# Patient Record
Sex: Male | Born: 1975 | Race: Black or African American | Hispanic: No | Marital: Married | Smoking: Never smoker
Health system: Southern US, Community
[De-identification: ages and names within clinical notes are randomized; demographics above are authoritative.]

## PROBLEM LIST (undated history)

## (undated) DIAGNOSIS — F419 Anxiety disorder, unspecified: Secondary | ICD-10-CM

## (undated) HISTORY — DX: Anxiety disorder, unspecified: F41.9

---

## 2001-12-08 ENCOUNTER — Encounter: Payer: Self-pay | Admitting: *Deleted

## 2001-12-08 ENCOUNTER — Emergency Department (HOSPITAL_COMMUNITY): Admission: EM | Admit: 2001-12-08 | Discharge: 2001-12-08 | Payer: Self-pay | Admitting: *Deleted

## 2004-01-08 ENCOUNTER — Emergency Department (HOSPITAL_COMMUNITY): Admission: EM | Admit: 2004-01-08 | Discharge: 2004-01-08 | Payer: Self-pay | Admitting: Emergency Medicine

## 2015-08-09 ENCOUNTER — Encounter: Payer: Self-pay | Admitting: Nurse Practitioner

## 2015-08-09 ENCOUNTER — Ambulatory Visit (INDEPENDENT_AMBULATORY_CARE_PROVIDER_SITE_OTHER): Payer: BLUE CROSS/BLUE SHIELD

## 2015-08-09 ENCOUNTER — Ambulatory Visit (INDEPENDENT_AMBULATORY_CARE_PROVIDER_SITE_OTHER): Payer: BLUE CROSS/BLUE SHIELD | Admitting: Nurse Practitioner

## 2015-08-09 VITALS — BP 111/77 | HR 93 | Temp 98.6°F | Ht 75.0 in | Wt 226.0 lb

## 2015-08-09 DIAGNOSIS — M5442 Lumbago with sciatica, left side: Secondary | ICD-10-CM

## 2015-08-09 MED ORDER — PREDNISONE 10 MG (21) PO TBPK: 10.0000 mg | ORAL_TABLET | Freq: Every day | ORAL | Status: DC

## 2015-08-09 MED ORDER — CYCLOBENZAPRINE HCL 10 MG PO TABS: 10.0000 mg | ORAL_TABLET | Freq: Three times a day (TID) | ORAL | Status: DC | PRN

## 2015-08-09 NOTE — Progress Notes (Signed)
Subjective:    Patient ID: Norman Hardy, male    DOB: 07-06-1975, 40 y.o.   MRN: [id number]   Patient in c/o back pain:  First incidence happened a year ago working in the yard pulling a bush from the ground. Hurts the most when rising from a sitting or lying position. Went to urgent care on Monday and they gave him a prescription for pain which does not help much.  Back Pain This is a recurrent problem. The current episode started more than 1 year ago. The problem occurs every several days. The problem has been gradually worsening since onset. The pain is present in the lumbar spine. The quality of the pain is described as aching, shooting and stabbing. The pain radiates to the right thigh and left thigh. The pain is at a severity of 9/10. The pain is severe. The pain is the same all the time. The symptoms are aggravated by lying down and standing. Stiffness is present in the morning. Associated symptoms include leg pain. He has tried heat for the symptoms. The treatment provided mild relief.      Review of Systems  Constitutional: Negative.   Cardiovascular: Negative.   Gastrointestinal: Negative.   Endocrine: Negative.   Musculoskeletal: Positive for back pain.  Skin: Negative.   Allergic/Immunologic: Negative.   Neurological: Negative.        Objective:   Physical Exam  Constitutional: He is oriented to person, place, and time. He appears well-developed and well-nourished.  HENT:  Right Ear: External ear normal.  Left Ear: External ear normal.  Nose: Nose normal.  Mouth/Throat: Oropharynx is clear and moist.  Eyes: Conjunctivae are normal.  Neck: Normal range of motion. Neck supple.  Cardiovascular: Normal rate, regular rhythm and normal heart sounds.   Pulmonary/Chest: Effort normal and breath sounds normal.  Abdominal: Soft. Bowel sounds are normal.  Musculoskeletal:   FROM of lumbar spine- as long as mover slowly Point tenderness mid lower back (+) SLR on left  at 45 degrees (-) SLR right Motor strength and sensation distally intact  Neurological: He is alert and oriented to person, place, and time.  Skin: Skin is warm and dry.  Psychiatric: He has a normal mood and affect. His behavior is normal. Judgment and thought content normal.  Vitals reviewed.   BP 111/77 mmHg  Pulse 93  Temp(Src) 98.6 F (37 C) (Oral)  Ht 6\' 3"  (1.905 m)  Wt 226 lb (102.513 kg)  BMI 28.25 kg/m2  Lumbar x ray- moderate stool burden on right- mild scoliosis otherwise normal- Preliminary reading by Paulene FloorMary Lashannon Bresnan, FNP  [facility]       Assessment & Plan:   1. Bilateral low back pain with left-sided sciatica    Meds ordered this encounter  Medications  . predniSONE (STERAPRED UNI-PAK 21 TAB) 10 MG (21) TBPK tablet    Sig: Take 1 tablet (10 mg total) by mouth daily. As directed x 6 days    Dispense:  21 tablet    Refill:  0    Order Specific Question:  Supervising Provider    Answer:  Ernestina PennaMOORE, DONALD W [[id number]]  . cyclobenzaprine (FLEXERIL) 10 MG tablet    Sig: Take 1 tablet (10 mg total) by mouth 3 (three) times daily as needed for muscle spasms.    Dispense:  30 tablet    Refill:  1    Order Specific Question:  Supervising Provider    Answer:  Ernestina PennaMOORE, DONALD W [[id number]]   Moist heat  Rest RTO prn Will order MRI if gets no relief from  Meds.  Midge Aver FNP Student .mmms

## 2015-08-09 NOTE — Patient Instructions (Signed)

## 2016-10-10 DIAGNOSIS — H0011 Chalazion right upper eyelid: Secondary | ICD-10-CM | POA: Diagnosis not present

## 2016-10-19 DIAGNOSIS — H00011 Hordeolum externum right upper eyelid: Secondary | ICD-10-CM | POA: Diagnosis not present

## 2016-12-31 ENCOUNTER — Ambulatory Visit (INDEPENDENT_AMBULATORY_CARE_PROVIDER_SITE_OTHER): Payer: BLUE CROSS/BLUE SHIELD | Admitting: Pediatrics

## 2016-12-31 ENCOUNTER — Encounter: Payer: Self-pay | Admitting: Pediatrics

## 2016-12-31 VITALS — BP 125/87 | HR 100 | Temp 99.0°F | Ht 75.0 in | Wt 232.0 lb

## 2016-12-31 DIAGNOSIS — K603 Anal fistula: Secondary | ICD-10-CM | POA: Diagnosis not present

## 2016-12-31 DIAGNOSIS — K611 Rectal abscess: Secondary | ICD-10-CM | POA: Diagnosis not present

## 2016-12-31 DIAGNOSIS — L0231 Cutaneous abscess of buttock: Secondary | ICD-10-CM | POA: Diagnosis not present

## 2016-12-31 NOTE — Progress Notes (Signed)
  Subjective:   Patient ID: Norman Hardy, male    DOB: Feb 20, 1976, 41 y.o.   MRN: [id number] CC: Recurrent Skin Infections  HPI: Norman Hardy is a 41 y.o. male presenting for Recurrent Skin Infections  Started about a month ago Has been working but has a lot of pain No fevers at home Appetite has been down Has been draining pus fluid, sometimes a lot, for the past 2 weeks Not seen a doctor for this before   Relevant past medical, surgical, family and social history reviewed. Allergies and medications reviewed and updated. History  Smoking Status  . Never Smoker  Smokeless Tobacco  . Never Used   ROS: Per HPI   Objective:    BP 125/87   Pulse 100   Temp 99 F (37.2 C) (Oral)   Ht 6\' 3"  (1.905 m)   Wt 232 lb (105.2 kg)   BMI 29.00 kg/m   Wt Readings from Last 3 Encounters:  12/31/16 232 lb (105.2 kg)  08/09/15 226 lb (102.5 kg)    Gen: NAD, alert, cooperative with exam, NCAT EYES: EOMI, no conjunctival injection, or no icterus CV: no murmur Resp:  normal WOB Abd: +BS, soft, NTND. Ext: No edema, warm Neuro: Alert and oriented MSK: normal muscle bulk Rectal: large approx 20cm area of induration to R of rectum along inside of gluteal cleft, three areas of drainage of white-yellow fluid  Assessment & Plan:  Norman Hardy was seen today for recurrent skin infections.  Diagnoses and all orders for this visit:  Rectal abscess Given location and size not able to drain in office. Pt to go to ED, may need imaging and surgical evaluation. Discussed with pt, he is in agreement.  Follow up plan: As needed Rex Kras, MD [facility]

## 2017-01-01 DIAGNOSIS — K610 Anal abscess: Secondary | ICD-10-CM | POA: Diagnosis not present

## 2017-01-01 DIAGNOSIS — F17200 Nicotine dependence, unspecified, uncomplicated: Secondary | ICD-10-CM | POA: Diagnosis not present

## 2017-01-01 DIAGNOSIS — K603 Anal fistula: Secondary | ICD-10-CM | POA: Diagnosis not present

## 2017-01-01 DIAGNOSIS — B9562 Methicillin resistant Staphylococcus aureus infection as the cause of diseases classified elsewhere: Secondary | ICD-10-CM | POA: Diagnosis not present

## 2017-01-01 DIAGNOSIS — L0231 Cutaneous abscess of buttock: Secondary | ICD-10-CM | POA: Diagnosis not present

## 2017-01-05 DIAGNOSIS — K603 Anal fistula: Secondary | ICD-10-CM | POA: Diagnosis not present

## 2017-01-05 DIAGNOSIS — Z48817 Encounter for surgical aftercare following surgery on the skin and subcutaneous tissue: Secondary | ICD-10-CM | POA: Diagnosis not present

## 2017-01-05 DIAGNOSIS — L0231 Cutaneous abscess of buttock: Secondary | ICD-10-CM | POA: Diagnosis not present

## 2017-01-06 DIAGNOSIS — K603 Anal fistula: Secondary | ICD-10-CM | POA: Diagnosis not present

## 2017-01-06 DIAGNOSIS — L0231 Cutaneous abscess of buttock: Secondary | ICD-10-CM | POA: Diagnosis not present

## 2017-01-06 DIAGNOSIS — Z48817 Encounter for surgical aftercare following surgery on the skin and subcutaneous tissue: Secondary | ICD-10-CM | POA: Diagnosis not present

## 2017-01-07 DIAGNOSIS — Z48817 Encounter for surgical aftercare following surgery on the skin and subcutaneous tissue: Secondary | ICD-10-CM | POA: Diagnosis not present

## 2017-01-07 DIAGNOSIS — L0231 Cutaneous abscess of buttock: Secondary | ICD-10-CM | POA: Diagnosis not present

## 2017-01-07 DIAGNOSIS — K603 Anal fistula: Secondary | ICD-10-CM | POA: Diagnosis not present

## 2017-01-08 DIAGNOSIS — K603 Anal fistula: Secondary | ICD-10-CM | POA: Diagnosis not present

## 2017-01-08 DIAGNOSIS — Z48817 Encounter for surgical aftercare following surgery on the skin and subcutaneous tissue: Secondary | ICD-10-CM | POA: Diagnosis not present

## 2017-01-08 DIAGNOSIS — L0231 Cutaneous abscess of buttock: Secondary | ICD-10-CM | POA: Diagnosis not present

## 2017-01-09 DIAGNOSIS — Z48817 Encounter for surgical aftercare following surgery on the skin and subcutaneous tissue: Secondary | ICD-10-CM | POA: Diagnosis not present

## 2017-01-09 DIAGNOSIS — L0231 Cutaneous abscess of buttock: Secondary | ICD-10-CM | POA: Diagnosis not present

## 2017-01-09 DIAGNOSIS — K610 Anal abscess: Secondary | ICD-10-CM | POA: Diagnosis not present

## 2017-01-10 DIAGNOSIS — L0231 Cutaneous abscess of buttock: Secondary | ICD-10-CM | POA: Diagnosis not present

## 2017-01-10 DIAGNOSIS — Z48817 Encounter for surgical aftercare following surgery on the skin and subcutaneous tissue: Secondary | ICD-10-CM | POA: Diagnosis not present

## 2017-01-10 DIAGNOSIS — K610 Anal abscess: Secondary | ICD-10-CM | POA: Diagnosis not present

## 2017-01-11 DIAGNOSIS — L0231 Cutaneous abscess of buttock: Secondary | ICD-10-CM | POA: Diagnosis not present

## 2017-01-11 DIAGNOSIS — K610 Anal abscess: Secondary | ICD-10-CM | POA: Diagnosis not present

## 2017-01-11 DIAGNOSIS — Z48817 Encounter for surgical aftercare following surgery on the skin and subcutaneous tissue: Secondary | ICD-10-CM | POA: Diagnosis not present

## 2017-01-13 DIAGNOSIS — L0231 Cutaneous abscess of buttock: Secondary | ICD-10-CM | POA: Diagnosis not present

## 2017-01-13 DIAGNOSIS — K610 Anal abscess: Secondary | ICD-10-CM | POA: Diagnosis not present

## 2017-01-13 DIAGNOSIS — Z48817 Encounter for surgical aftercare following surgery on the skin and subcutaneous tissue: Secondary | ICD-10-CM | POA: Diagnosis not present

## 2017-01-14 DIAGNOSIS — K610 Anal abscess: Secondary | ICD-10-CM | POA: Diagnosis not present

## 2017-01-14 DIAGNOSIS — L0231 Cutaneous abscess of buttock: Secondary | ICD-10-CM | POA: Diagnosis not present

## 2017-01-14 DIAGNOSIS — Z48817 Encounter for surgical aftercare following surgery on the skin and subcutaneous tissue: Secondary | ICD-10-CM | POA: Diagnosis not present

## 2017-01-15 DIAGNOSIS — L0231 Cutaneous abscess of buttock: Secondary | ICD-10-CM | POA: Diagnosis not present

## 2017-01-15 DIAGNOSIS — K610 Anal abscess: Secondary | ICD-10-CM | POA: Diagnosis not present

## 2017-01-15 DIAGNOSIS — Z48817 Encounter for surgical aftercare following surgery on the skin and subcutaneous tissue: Secondary | ICD-10-CM | POA: Diagnosis not present

## 2017-01-17 DIAGNOSIS — L0231 Cutaneous abscess of buttock: Secondary | ICD-10-CM | POA: Diagnosis not present

## 2017-01-17 DIAGNOSIS — K610 Anal abscess: Secondary | ICD-10-CM | POA: Diagnosis not present

## 2017-01-17 DIAGNOSIS — Z48817 Encounter for surgical aftercare following surgery on the skin and subcutaneous tissue: Secondary | ICD-10-CM | POA: Diagnosis not present

## 2017-01-18 DIAGNOSIS — K610 Anal abscess: Secondary | ICD-10-CM | POA: Diagnosis not present

## 2017-01-18 DIAGNOSIS — Z48817 Encounter for surgical aftercare following surgery on the skin and subcutaneous tissue: Secondary | ICD-10-CM | POA: Diagnosis not present

## 2017-01-18 DIAGNOSIS — L0231 Cutaneous abscess of buttock: Secondary | ICD-10-CM | POA: Diagnosis not present

## 2017-01-20 DIAGNOSIS — L0231 Cutaneous abscess of buttock: Secondary | ICD-10-CM | POA: Diagnosis not present

## 2017-01-20 DIAGNOSIS — Z6828 Body mass index (BMI) 28.0-28.9, adult: Secondary | ICD-10-CM | POA: Diagnosis not present

## 2017-01-21 DIAGNOSIS — L0231 Cutaneous abscess of buttock: Secondary | ICD-10-CM | POA: Diagnosis not present

## 2017-01-21 DIAGNOSIS — K610 Anal abscess: Secondary | ICD-10-CM | POA: Diagnosis not present

## 2017-01-21 DIAGNOSIS — Z48817 Encounter for surgical aftercare following surgery on the skin and subcutaneous tissue: Secondary | ICD-10-CM | POA: Diagnosis not present

## 2017-01-25 DIAGNOSIS — L0231 Cutaneous abscess of buttock: Secondary | ICD-10-CM | POA: Diagnosis not present

## 2017-01-25 DIAGNOSIS — Z48817 Encounter for surgical aftercare following surgery on the skin and subcutaneous tissue: Secondary | ICD-10-CM | POA: Diagnosis not present

## 2017-01-25 DIAGNOSIS — K610 Anal abscess: Secondary | ICD-10-CM | POA: Diagnosis not present

## 2017-01-26 DIAGNOSIS — K610 Anal abscess: Secondary | ICD-10-CM | POA: Diagnosis not present

## 2017-01-26 DIAGNOSIS — L0231 Cutaneous abscess of buttock: Secondary | ICD-10-CM | POA: Diagnosis not present

## 2017-01-26 DIAGNOSIS — Z48817 Encounter for surgical aftercare following surgery on the skin and subcutaneous tissue: Secondary | ICD-10-CM | POA: Diagnosis not present

## 2017-01-27 DIAGNOSIS — K610 Anal abscess: Secondary | ICD-10-CM | POA: Diagnosis not present

## 2017-01-27 DIAGNOSIS — L0231 Cutaneous abscess of buttock: Secondary | ICD-10-CM | POA: Diagnosis not present

## 2017-01-27 DIAGNOSIS — Z48817 Encounter for surgical aftercare following surgery on the skin and subcutaneous tissue: Secondary | ICD-10-CM | POA: Diagnosis not present

## 2017-01-28 DIAGNOSIS — K610 Anal abscess: Secondary | ICD-10-CM | POA: Diagnosis not present

## 2017-01-28 DIAGNOSIS — Z48817 Encounter for surgical aftercare following surgery on the skin and subcutaneous tissue: Secondary | ICD-10-CM | POA: Diagnosis not present

## 2017-01-28 DIAGNOSIS — L0231 Cutaneous abscess of buttock: Secondary | ICD-10-CM | POA: Diagnosis not present

## 2017-01-29 DIAGNOSIS — Z48817 Encounter for surgical aftercare following surgery on the skin and subcutaneous tissue: Secondary | ICD-10-CM | POA: Diagnosis not present

## 2017-01-29 DIAGNOSIS — L0231 Cutaneous abscess of buttock: Secondary | ICD-10-CM | POA: Diagnosis not present

## 2017-01-29 DIAGNOSIS — K610 Anal abscess: Secondary | ICD-10-CM | POA: Diagnosis not present

## 2017-01-31 DIAGNOSIS — Z48817 Encounter for surgical aftercare following surgery on the skin and subcutaneous tissue: Secondary | ICD-10-CM | POA: Diagnosis not present

## 2017-01-31 DIAGNOSIS — K610 Anal abscess: Secondary | ICD-10-CM | POA: Diagnosis not present

## 2017-01-31 DIAGNOSIS — L0231 Cutaneous abscess of buttock: Secondary | ICD-10-CM | POA: Diagnosis not present

## 2017-02-02 DIAGNOSIS — Z48817 Encounter for surgical aftercare following surgery on the skin and subcutaneous tissue: Secondary | ICD-10-CM | POA: Diagnosis not present

## 2017-02-02 DIAGNOSIS — L0231 Cutaneous abscess of buttock: Secondary | ICD-10-CM | POA: Diagnosis not present

## 2017-02-02 DIAGNOSIS — K610 Anal abscess: Secondary | ICD-10-CM | POA: Diagnosis not present

## 2017-02-03 DIAGNOSIS — L0231 Cutaneous abscess of buttock: Secondary | ICD-10-CM | POA: Diagnosis not present

## 2017-02-03 DIAGNOSIS — Z48817 Encounter for surgical aftercare following surgery on the skin and subcutaneous tissue: Secondary | ICD-10-CM | POA: Diagnosis not present

## 2017-02-03 DIAGNOSIS — K610 Anal abscess: Secondary | ICD-10-CM | POA: Diagnosis not present

## 2017-02-05 DIAGNOSIS — L0231 Cutaneous abscess of buttock: Secondary | ICD-10-CM | POA: Diagnosis not present

## 2017-02-05 DIAGNOSIS — K610 Anal abscess: Secondary | ICD-10-CM | POA: Diagnosis not present

## 2017-02-05 DIAGNOSIS — Z48817 Encounter for surgical aftercare following surgery on the skin and subcutaneous tissue: Secondary | ICD-10-CM | POA: Diagnosis not present

## 2017-02-08 DIAGNOSIS — Z48817 Encounter for surgical aftercare following surgery on the skin and subcutaneous tissue: Secondary | ICD-10-CM | POA: Diagnosis not present

## 2017-02-08 DIAGNOSIS — L0231 Cutaneous abscess of buttock: Secondary | ICD-10-CM | POA: Diagnosis not present

## 2017-02-08 DIAGNOSIS — K610 Anal abscess: Secondary | ICD-10-CM | POA: Diagnosis not present

## 2017-02-10 DIAGNOSIS — L0231 Cutaneous abscess of buttock: Secondary | ICD-10-CM | POA: Diagnosis not present

## 2017-02-10 DIAGNOSIS — Z48817 Encounter for surgical aftercare following surgery on the skin and subcutaneous tissue: Secondary | ICD-10-CM | POA: Diagnosis not present

## 2017-02-10 DIAGNOSIS — K610 Anal abscess: Secondary | ICD-10-CM | POA: Diagnosis not present

## 2017-02-12 DIAGNOSIS — Z48817 Encounter for surgical aftercare following surgery on the skin and subcutaneous tissue: Secondary | ICD-10-CM | POA: Diagnosis not present

## 2017-02-12 DIAGNOSIS — K610 Anal abscess: Secondary | ICD-10-CM | POA: Diagnosis not present

## 2017-02-12 DIAGNOSIS — L0231 Cutaneous abscess of buttock: Secondary | ICD-10-CM | POA: Diagnosis not present

## 2017-02-16 DIAGNOSIS — L0231 Cutaneous abscess of buttock: Secondary | ICD-10-CM | POA: Diagnosis not present

## 2017-02-16 DIAGNOSIS — Z48817 Encounter for surgical aftercare following surgery on the skin and subcutaneous tissue: Secondary | ICD-10-CM | POA: Diagnosis not present

## 2017-02-16 DIAGNOSIS — K610 Anal abscess: Secondary | ICD-10-CM | POA: Diagnosis not present

## 2017-02-17 DIAGNOSIS — L0231 Cutaneous abscess of buttock: Secondary | ICD-10-CM | POA: Diagnosis not present

## 2017-02-17 DIAGNOSIS — Z6828 Body mass index (BMI) 28.0-28.9, adult: Secondary | ICD-10-CM | POA: Diagnosis not present

## 2017-02-22 DIAGNOSIS — L0231 Cutaneous abscess of buttock: Secondary | ICD-10-CM | POA: Diagnosis not present

## 2017-02-22 DIAGNOSIS — Z48817 Encounter for surgical aftercare following surgery on the skin and subcutaneous tissue: Secondary | ICD-10-CM | POA: Diagnosis not present

## 2017-02-22 DIAGNOSIS — K610 Anal abscess: Secondary | ICD-10-CM | POA: Diagnosis not present

## 2017-02-24 DIAGNOSIS — K610 Anal abscess: Secondary | ICD-10-CM | POA: Diagnosis not present

## 2017-02-24 DIAGNOSIS — L0231 Cutaneous abscess of buttock: Secondary | ICD-10-CM | POA: Diagnosis not present

## 2017-02-24 DIAGNOSIS — Z48817 Encounter for surgical aftercare following surgery on the skin and subcutaneous tissue: Secondary | ICD-10-CM | POA: Diagnosis not present

## 2017-03-01 DIAGNOSIS — L0231 Cutaneous abscess of buttock: Secondary | ICD-10-CM | POA: Diagnosis not present

## 2017-03-01 DIAGNOSIS — Z48817 Encounter for surgical aftercare following surgery on the skin and subcutaneous tissue: Secondary | ICD-10-CM | POA: Diagnosis not present

## 2017-03-01 DIAGNOSIS — K610 Anal abscess: Secondary | ICD-10-CM | POA: Diagnosis not present

## 2017-03-03 DIAGNOSIS — L0231 Cutaneous abscess of buttock: Secondary | ICD-10-CM | POA: Diagnosis not present

## 2017-03-03 DIAGNOSIS — Z6827 Body mass index (BMI) 27.0-27.9, adult: Secondary | ICD-10-CM | POA: Diagnosis not present

## 2017-03-24 DIAGNOSIS — L0231 Cutaneous abscess of buttock: Secondary | ICD-10-CM | POA: Diagnosis not present

## 2017-03-24 DIAGNOSIS — Z6827 Body mass index (BMI) 27.0-27.9, adult: Secondary | ICD-10-CM | POA: Diagnosis not present

## 2017-03-26 ENCOUNTER — Encounter: Payer: Self-pay | Admitting: Pediatrics

## 2017-03-26 ENCOUNTER — Ambulatory Visit: Payer: BLUE CROSS/BLUE SHIELD | Admitting: Pediatrics

## 2017-03-26 VITALS — BP 136/86 | HR 76 | Temp 98.8°F | Ht 75.0 in | Wt 236.8 lb

## 2017-03-26 DIAGNOSIS — L700 Acne vulgaris: Secondary | ICD-10-CM

## 2017-03-26 DIAGNOSIS — L309 Dermatitis, unspecified: Secondary | ICD-10-CM | POA: Diagnosis not present

## 2017-03-26 MED ORDER — TRETINOIN 0.05 % EX GEL: 1.0000 "application " | Freq: Every day | CUTANEOUS | 1 refills | Status: DC

## 2017-03-26 MED ORDER — HYDROCORTISONE 1 % EX OINT: 1.0000 "application " | TOPICAL_OINTMENT | Freq: Two times a day (BID) | CUTANEOUS | 0 refills | Status: DC

## 2017-03-26 MED ORDER — MINOCYCLINE HCL 50 MG PO CAPS: 50.0000 mg | ORAL_CAPSULE | Freq: Two times a day (BID) | ORAL | 1 refills | Status: DC

## 2017-03-26 MED ORDER — BENZOYL PEROXIDE 4 % EX GEL: Freq: Every day | CUTANEOUS | 0 refills | Status: DC

## 2017-03-26 NOTE — Patient Instructions (Signed)
Can use gentle soap for washing face once a day such as cerave.   Pea sized amount of tretinoin all over face at night, wash off in the morning, if skin becomes red or peeling do every other day. No picking at acne or scabs.  Benzoyl peroxide every morning to acne spots.

## 2017-03-26 NOTE — Progress Notes (Signed)
  Subjective:   Patient ID: Norman Hardy, male    DOB: December 16, 1975, 41 y.o.   MRN: [id number] CC: Acne (Facial) and Rash  HPI: Norman BreedBenjamin L Tumblin is a 41 y.o. male presenting for Acne (Facial) and Rash  Has had had acne growing up Used to have small bumps Past year has had worsening  Now with red, tender nodules of acne on his face None on his back  No change in medicines Not taking anything now Treated with antibiotics while in the hospital a couple months ago were an abscess  Not using anything on his face right now Says it has cleared slightly with proactive but not enough to keep using it regularly  Has itching on his right arm Keeps him awake at night  Relevant past medical, surgical, family and social history reviewed. Allergies and medications reviewed and updated. Social History   Tobacco Use  Smoking Status Never Smoker  Smokeless Tobacco Never Used   ROS: Per HPI   Objective:    BP 136/86   Pulse 76   Temp 98.8 F (37.1 C) (Oral)   Ht 6\' 3"  (1.905 m)   Wt 236 lb 12.8 oz (107.4 kg)   BMI 29.60 kg/m   Wt Readings from Last 3 Encounters:  03/26/17 236 lb 12.8 oz (107.4 kg)  12/31/16 232 lb (105.2 kg)  08/09/15 226 lb (102.5 kg)    Gen: NAD, alert, cooperative with exam, NCAT EYES: EOMI, no conjunctival injection, or no icterus CV: NRRR, normal S1/S2, no murmur, distal pulses 2+ b/l Resp: CTABL, no wheezes, normal WOB Ext: No edema, warm Neuro: Alert and oriented Skin: Cystic acne in T zone of the face Forehead mostly spared Chin and cheeks most severe with 1-4 lesions with surrounding redness and each location No cystic acne or inflammatory lesions on back Some hyperpigmented scarring present Right upper arm with excoriations, some patches of lichenification  Assessment & Plan:  [name] was seen today for acne and rash.  Diagnoses and all orders for this visit:  Cystic acne vulgaris Start below, instructions given If not improving will  refer to dermatology for oral isotretinoin -     minocycline (MINOCIN,DYNACIN) 50 MG capsule; Take 1 capsule (50 mg total) 2 (two) times daily by mouth. -     tretinoin (ALTRALIN) 0.05 % gel; Apply 1 application at bedtime topically. -     benzoyl peroxide (BREVOXYL) 4 % gel; Apply at bedtime topically.  Eczema, unspecified type Use moisturizer regularly Low on itching areas -     hydrocortisone 1 % ointment; Apply 1 application 2 (two) times daily topically. Use on right arm   Follow up plan: Return in about 4 weeks (around 04/23/2017) for CPE. Rex Krasarol Vincent, MD [facility]

## 2017-04-05 ENCOUNTER — Telehealth: Payer: Self-pay | Admitting: Nurse Practitioner

## 2017-04-05 NOTE — Telephone Encounter (Signed)
tretinoin (ALTRALIN) 0.05 % gel cost over $200. Pt requesting a different medication. [place] [facility] [place] Please advise.

## 2017-04-07 MED ORDER — ADAPALENE 0.1 % EX GEL: Freq: Every day | CUTANEOUS | 0 refills | Status: DC

## 2017-04-07 NOTE — Telephone Encounter (Signed)
differin gel sent in, if also expensive he can ask his insurance company if they have preferred acne treatments that would be cheaper

## 2017-04-15 ENCOUNTER — Ambulatory Visit (INDEPENDENT_AMBULATORY_CARE_PROVIDER_SITE_OTHER): Payer: BLUE CROSS/BLUE SHIELD | Admitting: Physician Assistant

## 2017-04-15 VITALS — BP 114/86 | HR 108 | Temp 97.8°F | Wt 237.8 lb

## 2017-04-15 DIAGNOSIS — T7840XA Allergy, unspecified, initial encounter: Secondary | ICD-10-CM | POA: Diagnosis not present

## 2017-04-15 DIAGNOSIS — R21 Rash and other nonspecific skin eruption: Secondary | ICD-10-CM | POA: Diagnosis not present

## 2017-04-15 MED ORDER — METHYLPREDNISOLONE SODIUM SUCC 125 MG IJ SOLR
125.0000 mg | Freq: Once | INTRAMUSCULAR | Status: AC
  Administered 2017-04-15: 125 mg via INTRAMUSCULAR

## 2017-04-15 MED ORDER — LORATADINE 10 MG PO TABS: 10.0000 mg | ORAL_TABLET | Freq: Every day | ORAL | 11 refills | Status: DC

## 2017-04-15 MED ORDER — HYDROXYZINE HCL 25 MG PO TABS: 25.0000 mg | ORAL_TABLET | Freq: Four times a day (QID) | ORAL | 0 refills | Status: DC | PRN

## 2017-04-15 NOTE — Patient Instructions (Signed)
Drug Allergy A drug allergy is when the body's disease-fighting system (immune system) reacts badly to a medicine. Drug allergies range from mild to severe, and they can be life-threatening in some cases. Some allergic reactions occur one week or more after you are exposed to a medicine (delayed reaction). A sudden (acute), severe allergic reaction that affects multiple areas of the body is called an anaphylactic reaction (anaphylaxis). Anaphylaxis can be life-threatening. All allergic reactions to a medicine require medical evaluation, even if an allergic reaction appears to be mild (minor). What are the causes? Drug allergies happen when the immune system wrongly identifies a medicine as being harmful. When this happens, the body releases proteins (antibodies) and other compounds, such as histamine, into the bloodstream. This causes swelling in certain tissues and loss of blood pressure to important areas, such as the heart and lungs. Almost any medicine can cause an allergic reaction. Medicines that commonly cause allergic reactions (are common allergens) include:  Penicillin.  Sulfa medicines (sulfonamides).  Medicines that numb certain areas of the body (local anesthetics).  X-ray dyes that contain iodine.  What are the signs or symptoms? Mild Allergic Reaction  Nasal congestion.  Tingling in the mouth.  An itchy, red rash. Severe Allergic Reaction  Swelling of the eyes, lips, face, or tongue.  Swelling of the back of the mouth and the throat.  Wheezing.  A hoarse voice.  Itchy, red, swollen areas of skin (hives).  Dizziness or light-headedness.  Fainting.  Anxiety or confusion.  Abdominal pain.  Difficulty breathing, speaking, or swallowing.  Chest tightness.  Fast or irregular heartbeats (palpitations).  Vomiting.  Diarrhea. How is this diagnosed? This condition is diagnosed from a physical exam and your history of recent exposure to one or more medicines.  You may be referred for follow-up testing by a health care provider who specializes in allergies. This testing can confirm the diagnosis of a drug allergy and determine which medicines you are allergic to. Testing may include:  Skin tests. These may involve: ? Injecting a small amount of the possible allergen between layers of your skin (intradermal injection). ? Applying patches to your skin.  Blood tests.  Drug challenge. For this test, a health care provider gives you a small amount of a medicine in gradual doses while watching for an allergic reaction.  If you are unsure of what caused your allergic reaction, your health care provider may ask you for:  Information about all medicines that you take on a regular basis, including: ? The name of each medicine. ? How much (dosage) and how many times you take each medicine per day. ? The form of each medicine, such as pill, liquid, or injection.  The date and time of your reaction.  How is this treated? There is no cure for allergies. However, an allergic reaction can be treated with:  Medicines that help: ? To reduce pain and swelling (NSAIDs). ? To relieve itching and hives (antihistamines). ? To reduce swelling (corticosteroids).  Respiratory inhalers. These are inhaled medicines that help to widen (dilate) the airways in your lungs.  Injections of medicine that helps to relax the muscles in your airways and tighten your blood vessels (epinephrine).  Severe allergic reactions, such as anaphylaxis, require immediate treatment in a hospital. If you have an anaphylactic reaction, you may need to be hospitalized for observation. You may be prescribed rescue medicines, such as epinephrine. Epinephrine comes in many forms, including what is commonly called an auto-injector "pen" (pre-filled automatic  epinephrine injection device). Follow these instructions at home: If You Have a Severe Allergy  Always keep an auto-injector pen or your  anaphylaxis kit near you. These can be lifesaving if you have a severe reaction. Use your auto-injector pen or anaphylaxis kit as told by your health care provider.  Make sure that you, the members of your household, and your employer know: ? How to use an anaphylaxis kit. ? How to use an auto-injector pen to give you an epinephrine injection.  Replace your epinephrine immediately after you use your auto-injector pen, in case you have another reaction.  Wear a medical alert bracelet or necklace that states your drug allergy, if told by your health care provider. General instructions  Avoidmedicines that you are allergic to.  Take over-the-counter and prescription medicines only as told by your health care provider.  If you were given medicines to treat your reaction, do not drive until your health care provider approves.  If you have hives or a rash: ? Use an over-the-counter antihistamineas told by your health care provider. ? Apply cold, wet cloths (cold compresses) to your skin or take baths or showers in cool water. Avoid hot water.  If you had tests done, it is your responsibility to get your test results. Ask your health care provider when your results will be ready.  Tell any health care providers who care for you that you have a drug allergy.  Keep all follow-up visits as told by your health care provider. This is important. Contact a health care provider if:  You think that you are having an allergic reaction. Symptoms of an allergic reaction usually start within 30 minutes after you are exposed to a medicine.  You have symptoms that last more than 2 days after your reaction.  Your symptoms get worse.  You develop new symptoms. Get help right away if:  You needed to use epinephrine. ? An epinephrine injection helps to manage life-threatening allergic reactions, but you still need to go to the emergency room even if epinephrine seems to work. This is important because  anaphylaxis may happen again within 72 hours (rebound anaphylaxis). ? If you used epinephrine to treat anaphylaxis outside of the hospital, you need additional medical care. This may include more doses of epinephrine.  You develop symptoms of a severe allergic reaction. These symptoms may represent a serious problem that is an emergency. Do not wait to see if the symptoms will go away. Use your auto-injector pen or anaphylaxis kit as you have been instructed, and get medical help right away. Call your local emergency services (911 in the [place] Do not drive yourself to the hospital. This information is not intended to replace advice given to you by your health care provider. Make sure you discuss any questions you have with your health care provider. Document Released: 04/27/2005 Document Revised: 09/06/2015 Document Reviewed: 11/27/2014 Elsevier Interactive Patient Education  [employer].

## 2017-04-15 NOTE — Addendum Note (Signed)
Addended by: Bearl MulberryUTHERFORD, Brendi Mccarroll K on: 04/15/2017 04:35 PM   Modules accepted: Orders

## 2017-04-15 NOTE — Progress Notes (Signed)
BP 114/86 (BP Location: Right Arm, Patient Position: Supine, Cuff Size: Large)   Pulse (!) 108   Temp 97.8 F (36.6 C) (Oral)   Wt 237 lb 12.8 oz (107.9 kg)   SpO2 98%   BMI 29.72 kg/m    Subjective:    Patient ID: Norman Hardy, male    DOB: Mar 23, 1976, 41 y.o.   MRN: [id number]  HPI: Norman Hardy is a 41 y.o. male presenting on 04/15/2017 for Allergic Reaction (started 2 days ago, all over)  Approximately 4 days ago the patient started with hives on his inner thighs.  He thought this was just related to heat rash which she sometimes gets at work because his work environment is very hot.  However over the coming days he had increasing amounts of swelling redness, and itching.  It is now from his neck all the way to his toes.  He was quite nervous about the antibiotic he had started taking.  He stopped taking it 2 days ago.  Relevant past medical, surgical, family and social history reviewed and updated as indicated. Allergies and medications reviewed and updated.  No past medical history on file.  No past surgical history on file.  Review of Systems  Constitutional: Negative.  Negative for appetite change, chills, diaphoresis, fatigue and fever.  HENT: Negative.  Negative for facial swelling.   Eyes: Negative.  Negative for pain and visual disturbance.  Respiratory: Positive for shortness of breath. Negative for cough, chest tightness and wheezing.   Cardiovascular: Negative.  Negative for chest pain, palpitations and leg swelling.  Gastrointestinal: Negative.  Negative for abdominal pain, diarrhea, nausea and vomiting.  Endocrine: Negative.   Genitourinary: Negative.   Musculoskeletal: Negative.   Skin: Positive for color change and rash.  Neurological: Negative.  Negative for weakness, numbness and headaches.  Psychiatric/Behavioral: The patient is nervous/anxious.     Allergies as of 04/15/2017      Reactions   Minocycline Hcl Anaphylaxis      Medication  List        Accurate as of 04/15/17  1:58 PM. Always use your most recent med list.          adapalene 0.1 % gel Commonly known as:  DIFFERIN Apply topically at bedtime.   benzoyl peroxide 4 % gel Commonly known as:  BREVOXYL Apply at bedtime topically.   hydrocortisone 1 % ointment Apply 1 application 2 (two) times daily topically. Use on right arm   loratadine 10 MG tablet Commonly known as:  CLARITIN Take 1 tablet (10 mg total) by mouth daily.          Objective:    BP 114/86 (BP Location: Right Arm, Patient Position: Supine, Cuff Size: Large)   Pulse (!) 108   Temp 97.8 F (36.6 C) (Oral)   Wt 237 lb 12.8 oz (107.9 kg)   SpO2 98%   BMI 29.72 kg/m   Allergies  Allergen Reactions  . Minocycline Hcl Anaphylaxis    Physical Exam  Constitutional: He appears well-developed and well-nourished. He appears distressed.  When patient first arrived he was slightly hypotensive and tachycardic.  He states he was quite anxious.  Now that we have began treatment he feels much better and his vital signs have returned to his normal levels.  HENT:  Head: Normocephalic and atraumatic.  Eyes: Conjunctivae and EOM are normal. Pupils are equal, round, and reactive to light.  Cardiovascular: Normal rate, regular rhythm and normal heart sounds.  Pulmonary/Chest: Effort  normal and breath sounds normal. No respiratory distress.  Skin: Skin is warm and dry. Rash noted. Rash is urticarial. He is not diaphoretic. There is erythema.  Confluent patches of hives from his neck all the way to his toes.  All areas are covered.  Psychiatric: He has a normal mood and affect. His behavior is normal.  Nursing note and vitals reviewed.   No results found for this or any previous visit.    Assessment & Plan:   1. Allergic reaction, initial encounter - methylPREDNISolone sodium succinate (SOLU-MEDROL) 125 mg/2 mL injection 125 mg - loratadine (CLARITIN) 10 MG tablet; Take 1 tablet (10 mg total)  by mouth daily.  Dispense: 30 tablet; Refill: 11 Continue Benadryl 50 mg 4 times a day for the next 24 hours We will call patient later today to see if his status Anything gets suddenly worse call 911   Current Outpatient Medications:  .  adapalene (DIFFERIN) 0.1 % gel, Apply topically at bedtime., Disp: 45 g, Rfl: 0 .  benzoyl peroxide (BREVOXYL) 4 % gel, Apply at bedtime topically., Disp: 42.5 g, Rfl: 0 .  hydrocortisone 1 % ointment, Apply 1 application 2 (two) times daily topically. Use on right arm, Disp: 56 g, Rfl: 0 .  loratadine (CLARITIN) 10 MG tablet, Take 1 tablet (10 mg total) by mouth daily., Disp: 30 tablet, Rfl: 11 Continue all other maintenance medications as listed above.  Follow up plan: Return if symptoms worsen or fail to improve.  Educational handout given for allergic reaction  Remus LofflerAngel S. Shirleen Mcfaul PA-C [facility] [address]  [place], [place] [phone]   04/15/2017, 1:58 PM

## 2017-04-15 NOTE — Telephone Encounter (Signed)
Pt is to be seen

## 2017-04-16 ENCOUNTER — Telehealth: Payer: Self-pay | Admitting: Nurse Practitioner

## 2017-04-16 NOTE — Telephone Encounter (Signed)
Pt states hives and itching are some better Needs note for work Note to front  for pt pickup Okayed per Prudy FeelerAngel Jones

## 2017-04-21 ENCOUNTER — Other Ambulatory Visit: Payer: Self-pay

## 2017-04-21 MED ORDER — MINOCYCLINE HCL 50 MG PO CAPS: ORAL_CAPSULE | ORAL | 1 refills | Status: DC

## 2017-05-07 ENCOUNTER — Other Ambulatory Visit: Payer: Self-pay

## 2017-05-07 MED ORDER — HYDROXYZINE HCL 25 MG PO TABS: 25.0000 mg | ORAL_TABLET | Freq: Four times a day (QID) | ORAL | 0 refills | Status: DC | PRN

## 2017-05-07 NOTE — Telephone Encounter (Signed)
Last seen and filled 04/15/17 Lawanna KobusAngel

## 2017-05-26 DIAGNOSIS — L0231 Cutaneous abscess of buttock: Secondary | ICD-10-CM | POA: Diagnosis not present

## 2017-05-26 DIAGNOSIS — Z6827 Body mass index (BMI) 27.0-27.9, adult: Secondary | ICD-10-CM | POA: Diagnosis not present

## 2017-05-28 DIAGNOSIS — K603 Anal fistula: Secondary | ICD-10-CM | POA: Diagnosis not present

## 2017-05-28 DIAGNOSIS — L988 Other specified disorders of the skin and subcutaneous tissue: Secondary | ICD-10-CM | POA: Diagnosis not present

## 2017-05-28 DIAGNOSIS — L0231 Cutaneous abscess of buttock: Secondary | ICD-10-CM | POA: Diagnosis not present

## 2017-05-29 DIAGNOSIS — Z4889 Encounter for other specified surgical aftercare: Secondary | ICD-10-CM | POA: Diagnosis not present

## 2017-05-31 DIAGNOSIS — Z4889 Encounter for other specified surgical aftercare: Secondary | ICD-10-CM | POA: Diagnosis not present

## 2017-06-01 DIAGNOSIS — Z4889 Encounter for other specified surgical aftercare: Secondary | ICD-10-CM | POA: Diagnosis not present

## 2017-06-02 DIAGNOSIS — Z4889 Encounter for other specified surgical aftercare: Secondary | ICD-10-CM | POA: Diagnosis not present

## 2017-06-03 DIAGNOSIS — Z4889 Encounter for other specified surgical aftercare: Secondary | ICD-10-CM | POA: Diagnosis not present

## 2017-06-04 DIAGNOSIS — Z4889 Encounter for other specified surgical aftercare: Secondary | ICD-10-CM | POA: Diagnosis not present

## 2017-06-06 DIAGNOSIS — Z4889 Encounter for other specified surgical aftercare: Secondary | ICD-10-CM | POA: Diagnosis not present

## 2017-06-07 DIAGNOSIS — Z4889 Encounter for other specified surgical aftercare: Secondary | ICD-10-CM | POA: Diagnosis not present

## 2017-06-09 DIAGNOSIS — Z4889 Encounter for other specified surgical aftercare: Secondary | ICD-10-CM | POA: Diagnosis not present

## 2017-06-10 DIAGNOSIS — Z4889 Encounter for other specified surgical aftercare: Secondary | ICD-10-CM | POA: Diagnosis not present

## 2017-06-13 DIAGNOSIS — Z4889 Encounter for other specified surgical aftercare: Secondary | ICD-10-CM | POA: Diagnosis not present

## 2017-06-15 DIAGNOSIS — Z4889 Encounter for other specified surgical aftercare: Secondary | ICD-10-CM | POA: Diagnosis not present

## 2017-06-18 DIAGNOSIS — Z4889 Encounter for other specified surgical aftercare: Secondary | ICD-10-CM | POA: Diagnosis not present

## 2017-06-21 DIAGNOSIS — Z4889 Encounter for other specified surgical aftercare: Secondary | ICD-10-CM | POA: Diagnosis not present

## 2017-06-25 DIAGNOSIS — Z4889 Encounter for other specified surgical aftercare: Secondary | ICD-10-CM | POA: Diagnosis not present

## 2017-06-30 DIAGNOSIS — Z4889 Encounter for other specified surgical aftercare: Secondary | ICD-10-CM | POA: Diagnosis not present

## 2017-07-04 DIAGNOSIS — Z4889 Encounter for other specified surgical aftercare: Secondary | ICD-10-CM | POA: Diagnosis not present

## 2017-07-12 DIAGNOSIS — Z4889 Encounter for other specified surgical aftercare: Secondary | ICD-10-CM | POA: Diagnosis not present

## 2017-08-21 IMAGING — CR DG LUMBAR SPINE 2-3V
3 series · 3 of 3 positions shown · non-contrast
Comparison: None.

CLINICAL DATA: Lumbago for 5 days

EXAM:
LUMBAR SPINE - 2-3 VIEW

[view not recorded (1 of 3)]
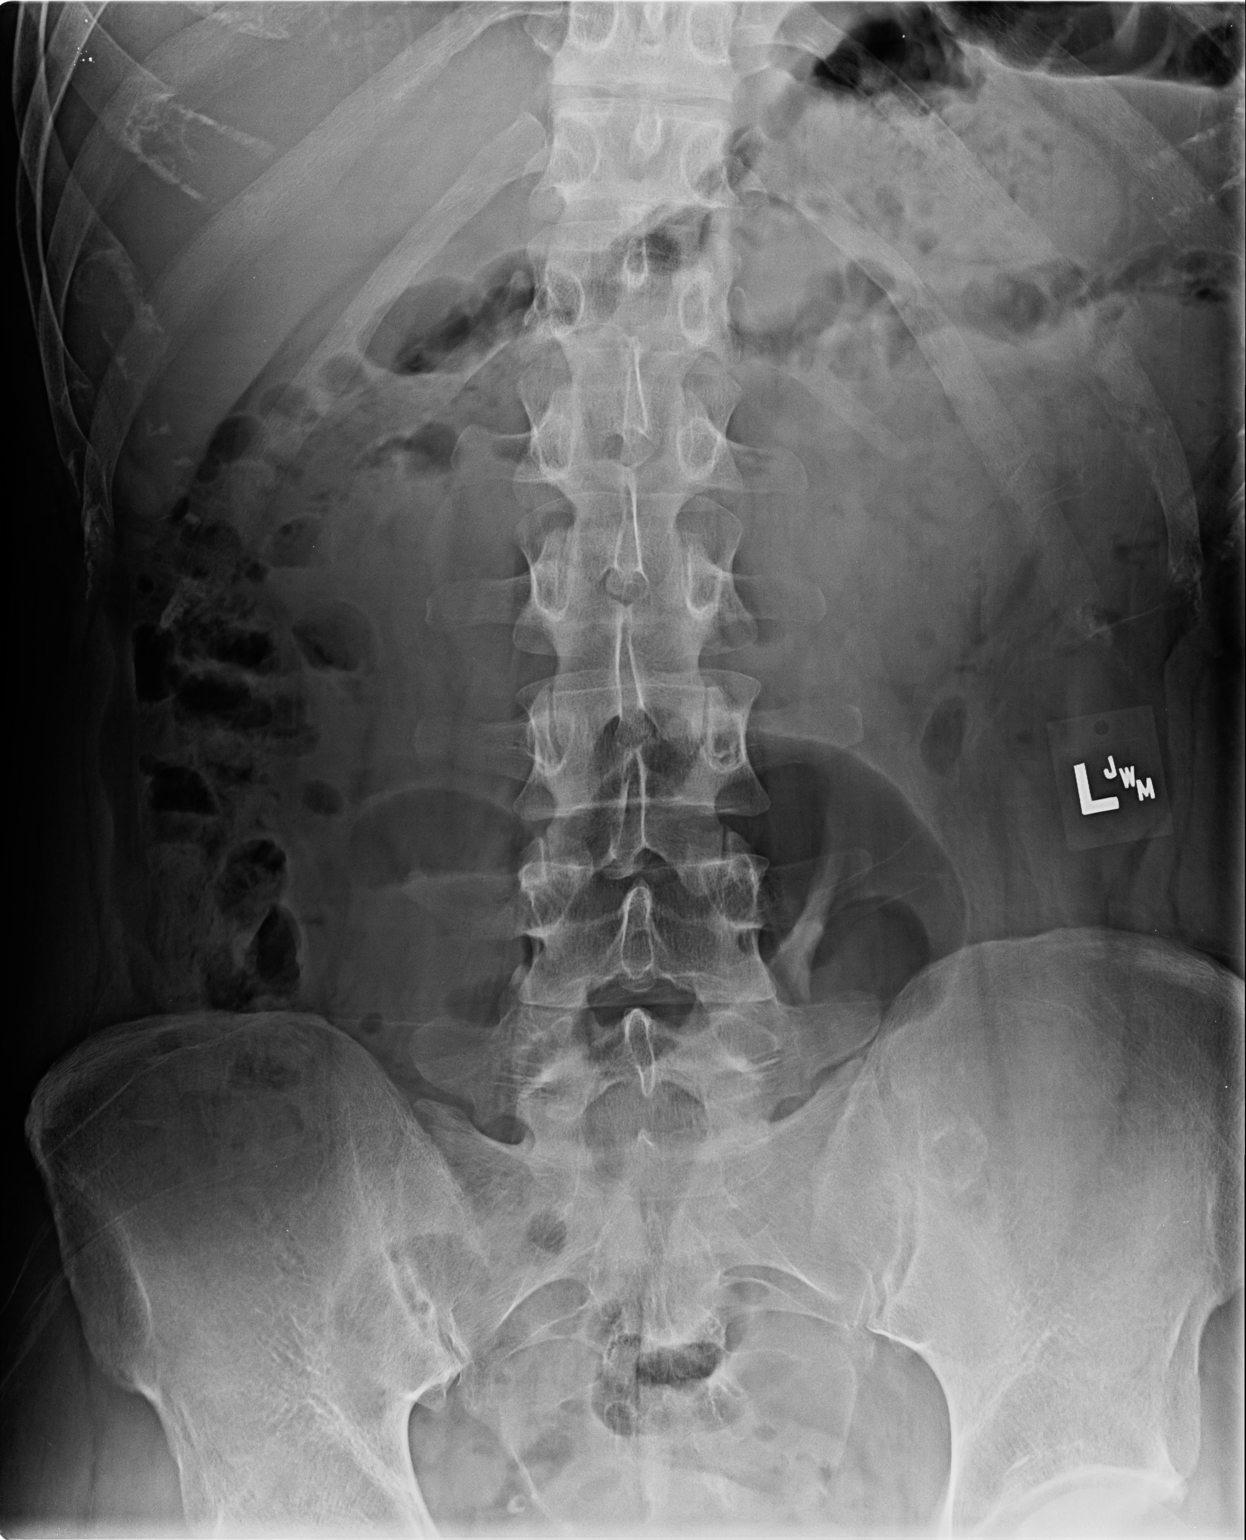

[view not recorded (2 of 3)]
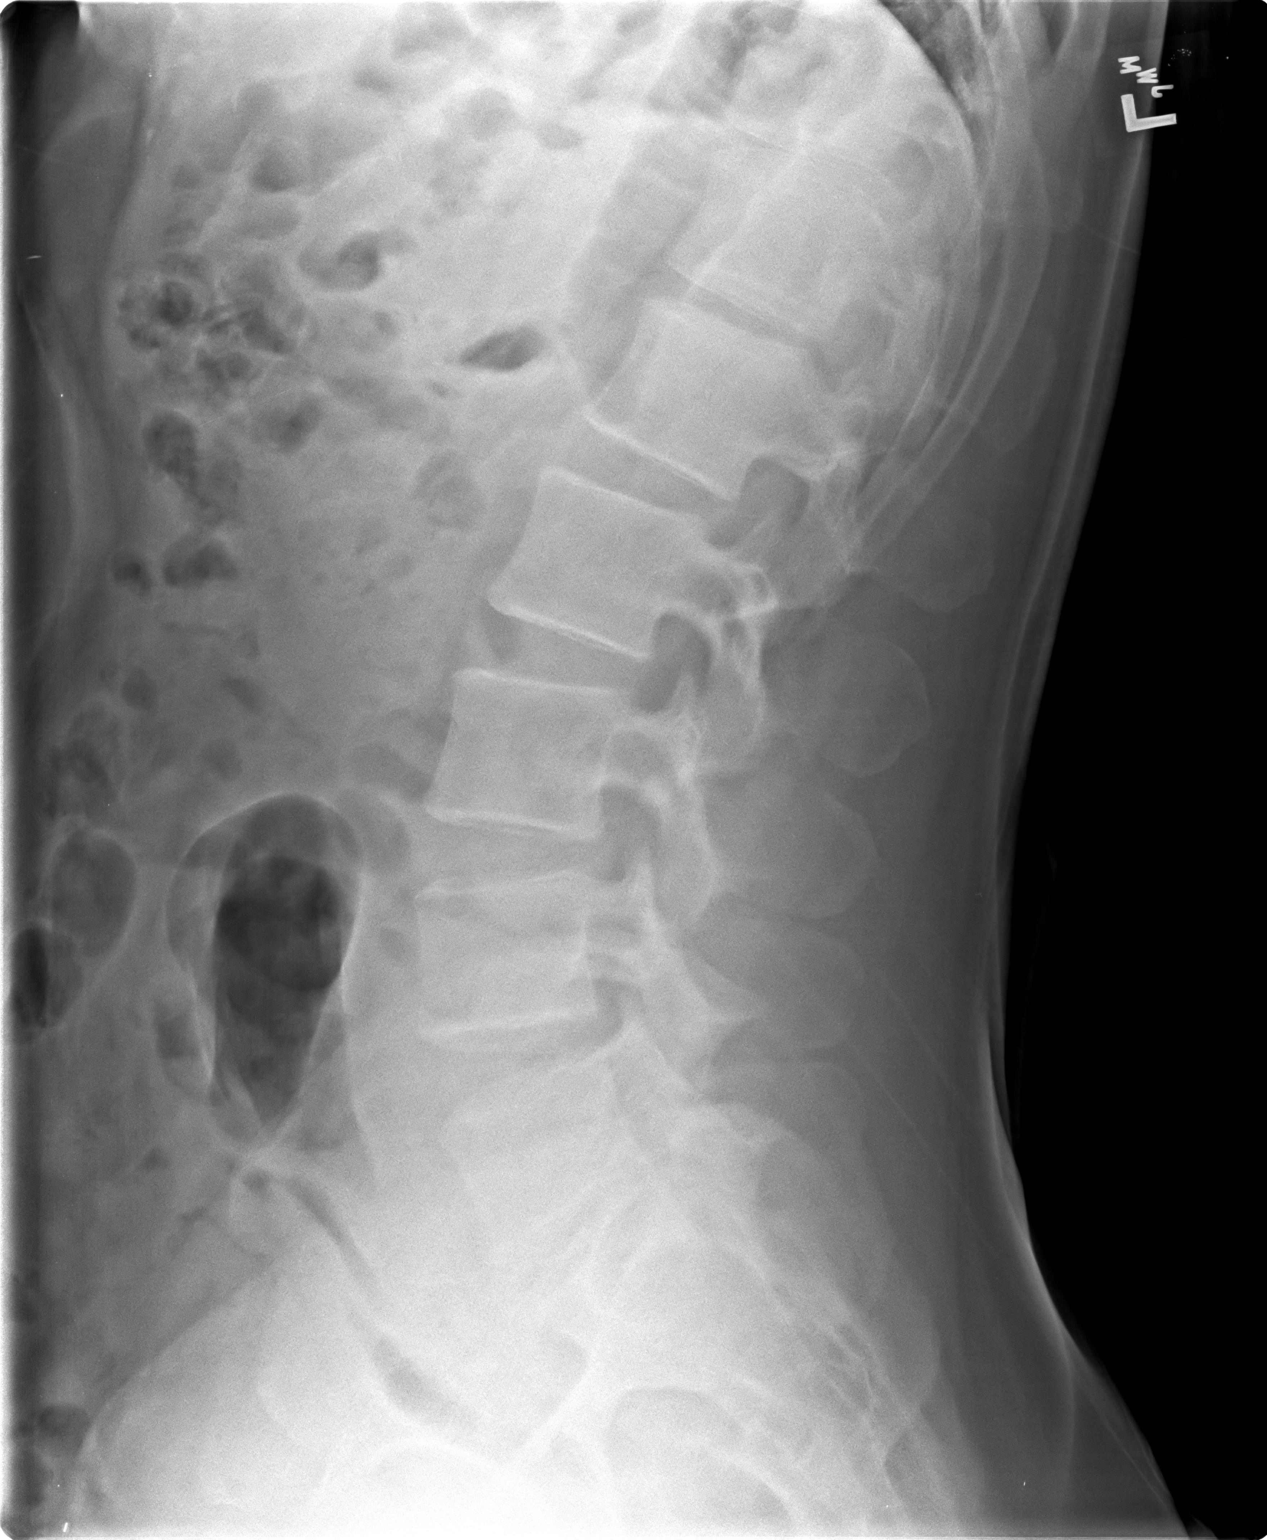

[view not recorded (3 of 3)]
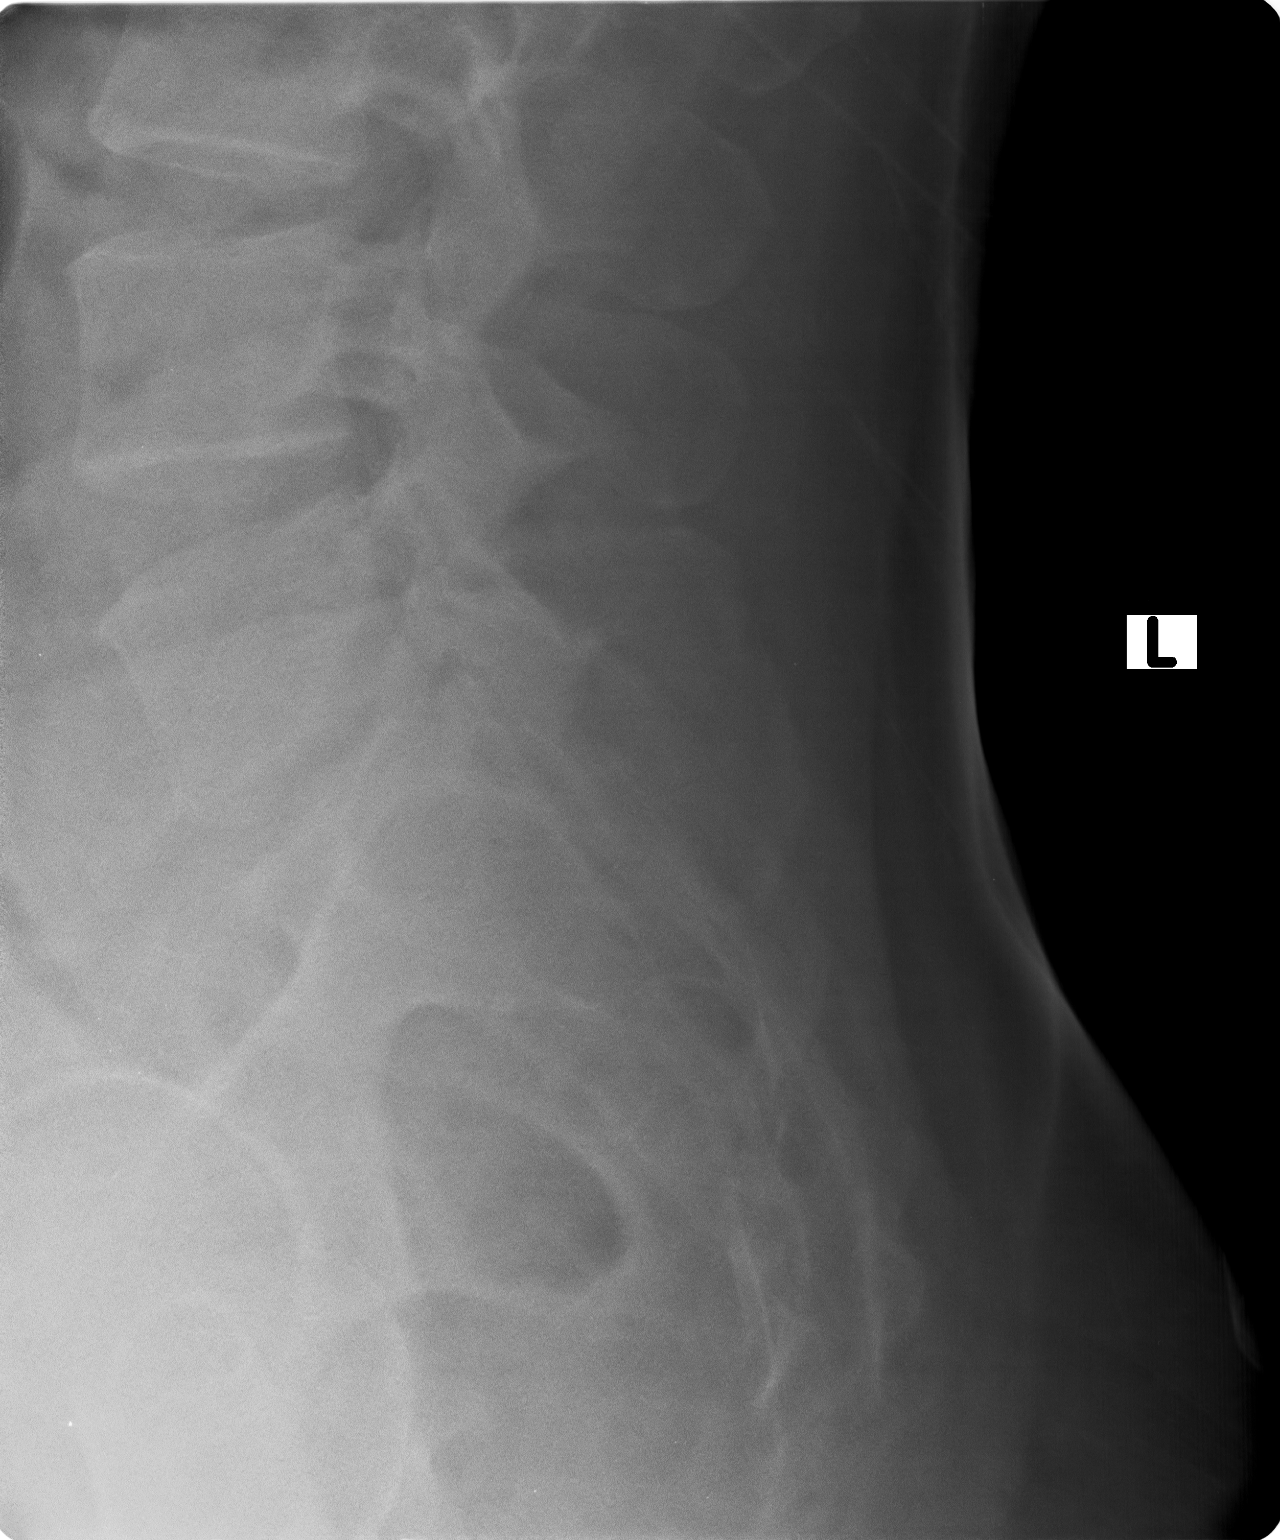

[3 of 3 positions shown; findings below may reference images not displayed]

FINDINGS: Frontal, lateral, and spot lumbosacral lateral images were obtained.
There are 5 non-rib-bearing lumbar type vertebral bodies. There is
no fracture or spondylolisthesis. The disc spaces appear
unremarkable. No erosive change.
IMPRESSION: No fracture or spondylolisthesis. No appreciable arthropathic
change.

## 2019-05-16 ENCOUNTER — Ambulatory Visit: Payer: 59 | Admitting: Nurse Practitioner

## 2019-05-16 ENCOUNTER — Telehealth: Payer: Self-pay | Admitting: Nurse Practitioner

## 2019-05-16 ENCOUNTER — Other Ambulatory Visit: Payer: Self-pay

## 2019-05-16 ENCOUNTER — Encounter: Payer: Self-pay | Admitting: Nurse Practitioner

## 2019-05-16 VITALS — BP 130/83 | HR 89 | Temp 98.9°F | Resp 20 | Ht 75.0 in | Wt 247.0 lb

## 2019-05-16 DIAGNOSIS — F410 Panic disorder [episodic paroxysmal anxiety] without agoraphobia: Secondary | ICD-10-CM

## 2019-05-16 DIAGNOSIS — R0789 Other chest pain: Secondary | ICD-10-CM

## 2019-05-16 MED ORDER — ESCITALOPRAM OXALATE 10 MG PO TABS: 10.0000 mg | ORAL_TABLET | Freq: Every day | ORAL | 5 refills | Status: DC

## 2019-05-16 NOTE — Progress Notes (Signed)
   Subjective:    Patient ID: Norman Hardy, male    DOB: 1975-10-08, 44 y.o.   MRN: [id number]   Chief Complaint: Anxiety   HPI Patient comes in today c/o anxiety. Has been occurring off and on for over a year. Has occasional chest tightness and relaxation relieves it. That last several months the pain has worsened. He denies any SOB. Occurs 3-4x a week. Lasts about  o longer then 3 minutes. No relation to eating. Denies any heart problems in family. GAD 7 : Generalized Anxiety Score 05/16/2019  Nervous, Anxious, on Edge 2  Control/stop worrying 3  Worry too much - different things 2  Trouble relaxing 0  Restless 1  Easily annoyed or irritable 2  Afraid - awful might happen 3  Total GAD 7 Score 13  Anxiety Difficulty Very difficult    Depression screen [facility] 2/9 05/16/2019 03/26/2017 12/31/2016  Decreased Interest 1 0 0  Down, Depressed, Hopeless 1 0 0  PHQ - 2 Score 2 0 0  Altered sleeping 1 - -  Tired, decreased energy 1 - -  Change in appetite 0 - -  Feeling bad or failure about yourself  1 - -  Trouble concentrating 0 - -  Moving slowly or fidgety/restless 1 - -  Suicidal thoughts 0 - -  PHQ-9 Score 6 - -  Difficult doing work/chores Somewhat difficult - -       Review of Systems  Constitutional: Negative for diaphoresis.  Eyes: Negative for pain.  Respiratory: Negative for shortness of breath.   Cardiovascular: Negative for chest pain, palpitations and leg swelling.  Gastrointestinal: Negative for abdominal pain.  Endocrine: Negative for polydipsia.  Skin: Negative for rash.  Neurological: Negative for dizziness, weakness and headaches.  Hematological: Does not bruise/bleed easily.  All other systems reviewed and are negative.      Objective:   Physical Exam Vitals and nursing note reviewed.  Constitutional:      Appearance: Normal appearance.  Cardiovascular:     Rate and Rhythm: Normal rate and regular rhythm.     Heart sounds: Normal heart sounds.    Skin:    General: Skin is warm.  Neurological:     General: No focal deficit present.     Mental Status: He is alert and oriented to person, place, and time.  Psychiatric:        Mood and Affect: Mood normal.     BP 130/83   Pulse 89   Temp 98.9 F (37.2 C)   Resp 20   Ht 6\' 3"  (1.905 m)   Wt 247 lb (112 kg)   SpO2 99%   BMI 30.87 kg/m   , FNP      Assessment & Plan:  Norman Hardy in today with chief complaint of Anxiety   1. Other chest pain - EKG 12-Lead  2. Panic attacks Stress management Exercise daily Side effects of meds discussed Follow up in 1 month - escitalopram (LEXAPRO) 10 MG tablet; Take 1 tablet (10 mg total) by mouth daily.  Dispense: 30 tablet; Refill: 5  Mary-Margaret Park Breed, FNP

## 2019-05-16 NOTE — Patient Instructions (Signed)

## 2019-06-07 ENCOUNTER — Other Ambulatory Visit: Payer: Self-pay | Admitting: Nurse Practitioner

## 2019-06-07 DIAGNOSIS — F410 Panic disorder [episodic paroxysmal anxiety] without agoraphobia: Secondary | ICD-10-CM

## 2019-06-13 ENCOUNTER — Ambulatory Visit: Payer: Self-pay | Admitting: Nurse Practitioner

## 2019-06-14 ENCOUNTER — Encounter: Payer: Self-pay | Admitting: Nurse Practitioner

## 2019-06-27 ENCOUNTER — Other Ambulatory Visit: Payer: Self-pay

## 2019-06-27 ENCOUNTER — Ambulatory Visit (INDEPENDENT_AMBULATORY_CARE_PROVIDER_SITE_OTHER): Payer: 59 | Admitting: Nurse Practitioner

## 2019-06-27 ENCOUNTER — Encounter: Payer: Self-pay | Admitting: Nurse Practitioner

## 2019-06-27 VITALS — BP 133/80 | HR 86 | Temp 97.8°F | Resp 20 | Ht 75.0 in | Wt 247.0 lb

## 2019-06-27 DIAGNOSIS — F410 Panic disorder [episodic paroxysmal anxiety] without agoraphobia: Secondary | ICD-10-CM | POA: Diagnosis not present

## 2019-06-27 NOTE — Progress Notes (Signed)
   Subjective:    Patient ID: Norman Hardy, male    DOB: Sep 15, 1975, 44 y.o.   MRN: [id number]   Chief Complaint: Discuss meds (Has stopped Lexapro. Made him feel weird)   HPI patinet was started on lexapro for panic attacks on 05/16/19. He only took it for 2 days and stopped it because it made him feel crazy. He is still having panic attacks and is anxious all the time. Occurs 2-3 times a week. Main symptom is chest tightness.  GAD 7 : Generalized Anxiety Score 05/16/2019  Nervous, Anxious, on Edge 2  Control/stop worrying 3  Worry too much - different things 2  Trouble relaxing 0  Restless 1  Easily annoyed or irritable 2  Afraid - awful might happen 3  Total GAD 7 Score 13  Anxiety Difficulty Very difficult       Review of Systems  Constitutional: Negative for diaphoresis.  Eyes: Negative for pain.  Respiratory: Negative for shortness of breath.   Cardiovascular: Negative for chest pain, palpitations and leg swelling.  Gastrointestinal: Negative for abdominal pain.  Endocrine: Negative for polydipsia.  Skin: Negative for rash.  Neurological: Negative for dizziness, weakness and headaches.  Hematological: Does not bruise/bleed easily.  All other systems reviewed and are negative.      Objective:   Physical Exam Vitals and nursing note reviewed.  Cardiovascular:     Rate and Rhythm: Normal rate and regular rhythm.     Heart sounds: Normal heart sounds.  Skin:    General: Skin is warm.  Neurological:     General: No focal deficit present.  Psychiatric:        Mood and Affect: Mood normal.        Behavior: Behavior normal.    BP 133/80   Pulse 86   Temp 97.8 F (36.6 C) (Temporal)   Resp 20   Ht 6\' 3"  (1.905 m)   Wt 247 lb (112 kg)   SpO2 98%   BMI 30.87 kg/m         Assessment & Plan:  in today with chief complaint of Discuss meds (Has stopped Lexapro. Made him feel weird)   1. Panic attacks Patient will go back on  lexapro- side effects reviewed again Stress management Follow up in 3 weeks Call if have any problems in the next 2 weeks    The above assessment and management plan was discussed with the patient. The patient verbalized understanding of and has agreed to the management plan. Patient is aware to call the clinic if symptoms persist or worsen. Patient is aware when to return to the clinic for a follow-up visit. Patient educated on when it is appropriate to go to the emergency department.   Mary-Margaret Park Breed, FNP

## 2019-06-27 NOTE — Patient Instructions (Signed)
Stress, Adult Stress is a normal reaction to life events. Stress is what you feel when life demands more than you are used to, or more than you think you can handle. Some stress can be useful, such as studying for a test or meeting a deadline at work. Stress that occurs too often or for too long can cause problems. It can affect your emotional health and interfere with relationships and normal daily activities. Too much stress can weaken your body's defense system (immune system) and increase your risk for physical illness. If you already have a medical problem, stress can make it worse. What are the causes? All sorts of life events can cause stress. An event that causes stress for one person may not be stressful for another person. Major life events, whether positive or negative, commonly cause stress. Examples include:  Losing a job or starting a new job.  Losing a loved one.  Moving to a new town or home.  Getting married or divorced.  Having a baby.  Getting injured or sick. Less obvious life events can also cause stress, especially if they occur day after day or in combination with each other. Examples include:  Working long hours.  Driving in traffic.  Caring for children.  Being in debt.  Being in a difficult relationship. What are the signs or symptoms? Stress can cause emotional symptoms, including:  Anxiety. This is feeling worried, afraid, on edge, overwhelmed, or out of control.  Anger, including irritation or impatience.  Depression. This is feeling sad, down, helpless, or guilty.  Trouble focusing, remembering, or making decisions. Stress can cause physical symptoms, including:  Aches and pains. These may affect your head, neck, back, stomach, or other areas of your body.  Tight muscles or a clenched jaw.  Low energy.  Trouble sleeping. Stress can cause unhealthy behaviors, including:  Eating to feel better (overeating) or skipping meals.  Working too  much or putting off tasks.  Smoking, drinking alcohol, or using drugs to feel better. How is this diagnosed? Stress is diagnosed through an assessment by your health care provider. He or she may diagnose this condition based on:  Your symptoms and any stressful life events.  Your medical history.  Tests to rule out other causes of your symptoms. Depending on your condition, your health care provider may refer you to a specialist for further evaluation. How is this treated?  Stress management techniques are the recommended treatment for stress. Medicine is not typically recommended for the treatment of stress. Techniques to reduce your reaction to stressful life events include:  Stress identification. Monitor yourself for symptoms of stress and identify what causes stress for you. These skills may help you to avoid or prepare for stressful events.  Time management. Set your priorities, keep a calendar of events, and learn to say no. Taking these actions can help you avoid making too many commitments. Techniques for coping with stress include:  Rethinking the problem. Try to think realistically about stressful events rather than ignoring them or overreacting. Try to find the positives in a stressful situation rather than focusing on the negatives.  Exercise. Physical exercise can release both physical and emotional tension. The key is to find a form of exercise that you enjoy and do it regularly.  Relaxation techniques. These relax the body and mind. The key is to find one or more that you enjoy and use the techniques regularly. Examples include: ? Meditation, deep breathing, or progressive relaxation techniques. ? Yoga or   tai chi. ? Biofeedback, mindfulness techniques, or journaling. ? Listening to music, being out in nature, or participating in other hobbies.  Practicing a healthy lifestyle. Eat a balanced diet, drink plenty of water, limit or avoid caffeine, and get plenty of  sleep.  Having a strong support network. Spend time with family, friends, or other people you enjoy being around. Express your feelings and talk things over with someone you trust. Counseling or talk therapy with a mental health professional may be helpful if you are having trouble managing stress on your own. Follow these instructions at home: Lifestyle   Avoid drugs.  Do not use any products that contain nicotine or tobacco, such as cigarettes, e-cigarettes, and chewing tobacco. If you need help quitting, ask your health care provider.  Limit alcohol intake to no more than 1 drink a day for nonpregnant women and 2 drinks a day for men. One drink equals 12 oz of beer, 5 oz of wine, or 1 oz of hard liquor  Do not use alcohol or drugs to relax.  Eat a balanced diet that includes fresh fruits and vegetables, whole grains, lean meats, fish, eggs, and beans, and low-fat dairy. Avoid processed foods and foods high in added fat, sugar, and salt.  Exercise at least 30 minutes on 5 or more days each week.  Get 7-8 hours of sleep each night. General instructions   Practice stress management techniques as discussed with your health care provider.  Drink enough fluid to keep your urine clear or pale yellow.  Take over-the-counter and prescription medicines only as told by your health care provider.  Keep all follow-up visits as told by your health care provider. This is important. Contact a health care provider if:  Your symptoms get worse.  You have new symptoms.  You feel overwhelmed by your problems and can no longer manage them on your own. Get help right away if:  You have thoughts of hurting yourself or others. If you ever feel like you may hurt yourself or others, or have thoughts about taking your own life, get help right away. You can go to your nearest emergency department or call:  Your local emergency services (911 in the [place]  A suicide crisis helpline, such as the  [place] at (316) 250-6172. This is open 24 hours a day. Summary  Stress is a normal reaction to life events. It can cause problems if it happens too often or for too long.  Practicing stress management techniques is the best way to treat stress.  Counseling or talk therapy with a mental health professional may be helpful if you are having trouble managing stress on your own. This information is not intended to replace advice given to you by your health care provider. Make sure you discuss any questions you have with your health care provider. Document Revised: 11/25/2018 Document Reviewed: 06/17/2016 Elsevier Patient Education  [place].

## 2019-12-21 ENCOUNTER — Ambulatory Visit (INDEPENDENT_AMBULATORY_CARE_PROVIDER_SITE_OTHER): Payer: Self-pay | Admitting: Gastroenterology

## 2021-03-11 ENCOUNTER — Encounter: Payer: Self-pay | Admitting: Nurse Practitioner

## 2021-03-11 ENCOUNTER — Ambulatory Visit (INDEPENDENT_AMBULATORY_CARE_PROVIDER_SITE_OTHER): Payer: 59 | Admitting: Nurse Practitioner

## 2021-03-11 ENCOUNTER — Other Ambulatory Visit: Payer: Self-pay

## 2021-03-11 VITALS — BP 129/88 | HR 101 | Temp 98.1°F | Resp 20 | Ht 75.0 in | Wt 244.0 lb

## 2021-03-11 DIAGNOSIS — L209 Atopic dermatitis, unspecified: Secondary | ICD-10-CM | POA: Diagnosis not present

## 2021-03-11 MED ORDER — PREDNISONE 10 MG (21) PO TBPK: ORAL_TABLET | ORAL | 0 refills | Status: DC

## 2021-03-11 MED ORDER — SULFAMETHOXAZOLE-TRIMETHOPRIM 800-160 MG PO TABS: 1.0000 | ORAL_TABLET | Freq: Two times a day (BID) | ORAL | 0 refills | Status: DC

## 2021-03-11 NOTE — Progress Notes (Signed)
   Subjective:    Patient ID: Norman Hardy, male    DOB: [birth year], 45 y.o.   MRN: [id number]  Chief Complaint: Rash on face (Itching and painful/)   HPI Patient comes in c/o rash on his face. Face is red, itching and burning. He denies using any soaps or detergents on face. Has been there about 2 weeks and he has used anything on it.    Review of Systems  Constitutional:  Negative for diaphoresis.  Eyes:  Negative for pain.  Respiratory:  Negative for shortness of breath.   Cardiovascular:  Negative for chest pain, palpitations and leg swelling.  Gastrointestinal:  Negative for abdominal pain.  Endocrine: Negative for polydipsia.  Skin:  Negative for rash.  Neurological:  Negative for dizziness, weakness and headaches.  Hematological:  Does not bruise/bleed easily.  All other systems reviewed and are negative.     Objective:   Physical Exam Vitals reviewed.  Constitutional:      Appearance: Normal appearance.  Cardiovascular:     Rate and Rhythm: Normal rate and regular rhythm.     Heart sounds: Normal heart sounds.  Pulmonary:     Effort: Pulmonary effort is normal.     Breath sounds: Normal breath sounds.  Skin:    Comments: Erythematous bumpy dry rash on nose and cheeks.  Neurological:     Mental Status: He is alert.    BP 129/88   Pulse (!) 101   Temp 98.1 F (36.7 C) (Temporal)   Resp 20   Ht 6\' 3"  (1.905 m)   Wt 244 lb (110.7 kg)   SpO2 95%   BMI 30.50 kg/m         Assessment & Plan:   in today with chief complaint of Rash on face (Itching and painful/)   1. Atopic dermatitis, unspecified type Cetaphil to clean and moisturize Avoid rubbing or scratching Meds ordered this encounter  Medications   predniSONE (STERAPRED UNI-PAK 21 TAB) 10 MG (21) TBPK tablet    Sig: As directed x 6 days    Dispense:  21 tablet    Refill:  0    Order Specific Question:   Supervising Provider    Answer:   Park Breed A [[id number]]    sulfamethoxazole-trimethoprim (BACTRIM DS) 800-160 MG tablet    Sig: Take 1 tablet by mouth 2 (two) times daily.    Dispense:  20 tablet    Refill:  0    Order Specific Question:   Supervising Provider    Answer:   Arville Care A [[id number]]       The above assessment and management plan was discussed with the patient. The patient verbalized understanding of and has agreed to the management plan. Patient is aware to call the clinic if symptoms persist or worsen. Patient is aware when to return to the clinic for a follow-up visit. Patient educated on when it is appropriate to go to the emergency department.   Mary-Margaret Arville Care, FNP

## 2021-03-11 NOTE — Patient Instructions (Signed)
Atopic Dermatitis Atopic dermatitis is a skin disorder that causes inflammation of the skin. It is marked by a red rash and itchy, dry, scaly skin. It is the most common type of eczema. Eczema is a group of skin conditions that cause the skin to become rough and swollen. This condition is generally worse during the cooler wintermonths and often improves during the warm summer months. Atopic dermatitis usually starts showing signs in infancy and can last through adulthood. This condition cannot be passed from one person to another (is not contagious). Atopic dermatitis may not always be present, but when it is, it is called aflare-up. What are the causes? The exact cause of this condition is not known. Flare-ups may be triggered by: Coming in contact with something that you are sensitive or allergic to (allergen). Stress. Certain foods. Extremely hot or cold weather. Harsh chemicals and soaps. Dry air. Chlorine. What increases the risk? This condition is more likely to develop in people who have a personal or family history of: Eczema. Allergies. Asthma. Hay fever. What are the signs or symptoms? Symptoms of this condition include: Dry, scaly skin. Red, itchy rash. Itchiness, which can be severe. This may occur before the skin rash. This can make sleeping difficult. Skin thickening and cracking that can occur over time. How is this diagnosed? This condition is diagnosed based on: Your symptoms. Your medical history. A physical exam. How is this treated? There is no cure for this condition, but symptoms can usually be controlled. Treatment focuses on: Controlling the itchiness and scratching. You may be given medicines, such as antihistamines or steroid creams. Limiting exposure to allergens. Recognizing situations that cause stress and developing a plan to manage stress. If your atopic dermatitis does not get better with medicines, or if it is all over your body (widespread), a  treatment using a specific type of light (phototherapy) may be used. Follow these instructions at home: Skin care  Keep your skin well moisturized. Doing this seals in moisture and helps to prevent dryness. Use unscented lotions that have petroleum in them. Avoid lotions that contain alcohol or water. They can dry the skin. Keep baths or showers short (less than 5 minutes) in warm water. Do not use hot water. Use mild, unscented cleansers for bathing. Avoid soap and bubble bath. Apply a moisturizer to your skin right after a bath or shower. Do not apply anything to your skin without checking with your health care provider.  General instructions Take or apply over-the-counter and prescription medicines only as told by your health care provider. Dress in clothes made of cotton or cotton blends. Dress lightly because heat increases itchiness. When washing your clothes, rinse your clothes twice so all of the soap is removed. Avoid any triggers that can cause a flare-up. Keep your fingernails cut short. Avoid scratching. Scratching makes the rash and itchiness worse. A break in the skin from scratching could result in a skin infection (impetigo). Do not be around people who have cold sores or fever blisters. If you get the infection, it may cause your atopic dermatitis to worsen. Keep all follow-up visits. This is important. Contact a health care provider if: Your itchiness interferes with sleep. Your rash gets worse or is not better within one week of starting treatment. You have a fever. You have a rash flare-up after having contact with someone who has cold sores or fever blisters. Get help right away if: You develop pus or soft yellow scabs in the rash area.   Summary Atopic dermatitis causes a red rash and itchy, dry, scaly skin. Treatment focuses on controlling the itchiness and scratching, limiting exposure to things that you are sensitive or allergic to (allergens), recognizing  situations that cause stress, and developing a plan to manage stress. Keep your skin well moisturized. Keep baths or showers shorter than 5 minutes and use warm water. Do not use hot water. This information is not intended to replace advice given to you by your health care provider. Make sure you discuss any questions you have with your healthcare provider. Document Revised: 02/05/2020 Document Reviewed: 02/05/2020 Elsevier Patient Education  2022 Else[employer] Inc.  

## 2021-03-28 ENCOUNTER — Other Ambulatory Visit: Payer: Self-pay

## 2021-03-28 ENCOUNTER — Encounter: Payer: Self-pay | Admitting: Nurse Practitioner

## 2021-03-28 ENCOUNTER — Ambulatory Visit (INDEPENDENT_AMBULATORY_CARE_PROVIDER_SITE_OTHER): Payer: 59 | Admitting: Nurse Practitioner

## 2021-03-28 VITALS — BP 116/89 | HR 74 | Temp 98.4°F | Resp 20 | Ht 75.0 in | Wt 242.0 lb

## 2021-03-28 DIAGNOSIS — L719 Rosacea, unspecified: Secondary | ICD-10-CM | POA: Diagnosis not present

## 2021-03-28 MED ORDER — METRONIDAZOLE 1 % EX GEL: Freq: Every day | CUTANEOUS | 0 refills | Status: DC

## 2021-03-28 MED ORDER — CLINDAMYCIN HCL 300 MG PO CAPS: 300.0000 mg | ORAL_CAPSULE | Freq: Three times a day (TID) | ORAL | 0 refills | Status: DC

## 2021-03-28 NOTE — Patient Instructions (Signed)
Rosacea Rosacea is a long-term (chronic) condition that affects the skin of the face, including the cheeks, nose, forehead, and chin. This condition can also affect the eyes. Rosacea causes blood vessels near the surface of the skin to enlarge, which results in redness. What are the causes? The cause of this condition is not known. Certain triggers can make rosacea worse, including: Hot baths. Exercise. Sunlight. Very hot or cold temperatures. Hot or spicy foods and drinks. Drinking alcohol. Stress. Taking blood pressure medicine. Long-term use of topical steroids on the face. What increases the risk? You are more likely to develop this condition if you: Are older than 46 years of age. Are a woman. Have light-colored skin (light complexion). Have a family history of rosacea. What are the signs or symptoms? Symptoms of this condition include: Redness of the face. Red bumps or pimples on the face. A red, enlarged nose. Blushing easily. Red lines on the skin. Irritated, burning, or itchy feeling in the eyes. Swollen eyelids. Drainage from the eyes. Feeling like there is something in your eye. How is this diagnosed? This condition is diagnosed with a medical history and physical exam. How is this treated? There is no cure for this condition, but treatment can help to control your symptoms. Your health care provider may recommend that you see a skin specialist (dermatologist). Treatment may include: Medicines that are applied to the skin or taken by mouth (orally). This can include antibiotic medicines. Laser treatment to improve the appearance of the skin. Surgery. This is rare. Your health care provider will also recommend the best way to take care of your skin. Even after your skin improves, you will likely need to continue treatment to prevent your rosacea from coming back. Follow these instructions at home: Skin care Take care of your skin as told by your health care provider.  You may be told to do these things: Wash your skin gently two or more times each day. Use mild soap. Use a sunscreen or sunblock with SPF 30 or greater. Use gentle cosmetics that are meant for sensitive skin. Shave with an electric shaver instead of a blade. Lifestyle Try to keep track of what foods trigger this condition. Avoid any triggers. These may include: Spicy foods. Seafood. Cheese. Hot liquids. Nuts. Chocolate. Iodized salt. Do not drink alcohol. Avoid extremely cold or hot temperatures. Try to reduce your stress. If you need help, talk with your health care provider. When you exercise, do these things to stay cool: Limit sun exposure to your face. Use a fan. Do shorter and more frequent intervals of exercise. General instructions Take and apply over-the-counter and prescription medicines only as told by your health care provider. If you were prescribed an antibiotic medicine, apply it or take it as told by your health care provider. Do not stop using the antibiotic even if your condition improves. If your eyelids are affected, apply warm compresses to them. Do this as told by your health care provider. Keep all follow-up visits as told by your health care provider. This is important. Contact a health care provider if: Your symptoms get worse. Your symptoms do not improve after 2 months of treatment. You have new symptoms. You have any changes in vision or you have problems with your eyes, such as redness or itching. You feel depressed. You lose your appetite. You have trouble concentrating. Summary Rosacea is a long-term (chronic) condition that affects the skin of the face, including the cheeks, nose, forehead, and chin. Take   care of your skin as told by your health care provider. Take and apply over-the-counter and prescription medicines only as told by your health care provider. Contact a health care provider if your symptoms get worse or if you have any changes in  vision or other problems with your eyes, such as redness or itching. Keep all follow-up visits as told by your health care provider. This is important. This information is not intended to replace advice given to you by your health care provider. Make sure you discuss any questions you have with your health care provider. Document Revised: 09/29/2017 Document Reviewed: 09/29/2017 Elsevier Patient Education  2022 Elsevier Inc.  

## 2021-03-28 NOTE — Progress Notes (Signed)
   Subjective:    Patient ID: Norman Hardy, male    DOB: 10-02-1975, 45 y.o.   MRN: [id number]   Chief Complaint: Rash on face   HPI Patient comes in today with worsening rash on face. He was seen on 03/11/21  and was dx with atopic dermatitis. He was given prednisone and bactrim. He says rash is no better. Burns and stings.    Review of Systems  Constitutional:  Negative for diaphoresis.  Eyes:  Negative for pain.  Respiratory:  Negative for shortness of breath.   Cardiovascular:  Negative for chest pain, palpitations and leg swelling.  Gastrointestinal:  Negative for abdominal pain.  Endocrine: Negative for polydipsia.  Skin:  Negative for rash.  Neurological:  Negative for dizziness, weakness and headaches.  Hematological:  Does not bruise/bleed easily.  All other systems reviewed and are negative.     Objective:   Physical Exam Vitals and nursing note reviewed.  Constitutional:      Appearance: Normal appearance.  Cardiovascular:     Rate and Rhythm: Normal rate and regular rhythm.     Heart sounds: Normal heart sounds.  Pulmonary:     Effort: Pulmonary effort is normal.     Breath sounds: Normal breath sounds.  Skin:    General: Skin is warm.     Comments: Open and closed cone domes with erythema on nose and cheeks  Neurological:     General: No focal deficit present.     Mental Status: He is alert and oriented to person, place, and time.  Psychiatric:        Mood and Affect: Mood normal.        Behavior: Behavior normal.           Assessment & Plan:  Park Breed in today with chief complaint of Rash on face   1. Rosacea Do not pick or scratch Cool compresses Referral to derm - clindamycin (CLEOCIN) 300 MG capsule; Take 1 capsule (300 mg total) by mouth 3 (three) times daily.  Dispense: 30 capsule; Refill: 0 - metroNIDAZOLE (METROGEL) 1 % gel; Apply topically daily.  Dispense: 45 g; Refill: 0 - Ambulatory referral to  Dermatology    The above assessment and management plan was discussed with the patient. The patient verbalized understanding of and has agreed to the management plan. Patient is aware to call the clinic if symptoms persist or worsen. Patient is aware when to return to the clinic for a follow-up visit. Patient educated on when it is appropriate to go to the emergency department.   Mary-Margaret Daphine Deutscher, FNP

## 2021-10-09 ENCOUNTER — Encounter: Payer: Self-pay | Admitting: Physician Assistant

## 2021-10-09 ENCOUNTER — Ambulatory Visit (INDEPENDENT_AMBULATORY_CARE_PROVIDER_SITE_OTHER): Payer: 59 | Admitting: Physician Assistant

## 2021-10-09 DIAGNOSIS — L719 Rosacea, unspecified: Secondary | ICD-10-CM | POA: Diagnosis not present

## 2021-10-09 MED ORDER — METRONIDAZOLE 0.75 % EX CREA: TOPICAL_CREAM | Freq: Two times a day (BID) | CUTANEOUS | 0 refills | Status: DC

## 2021-10-09 MED ORDER — DOXYCYCLINE HYCLATE 100 MG PO CAPS: 100.0000 mg | ORAL_CAPSULE | Freq: Every day | ORAL | 4 refills | Status: DC

## 2021-10-09 NOTE — Progress Notes (Signed)
   New Patient   Subjective  Norman Hardy is a 46 y.o. male who presents for the following: Skin Problem (Patient has some breaking out on his face. X years. Recently it has gotten worse and it is very painful. Patient has tried  OTC acne medications nothing seems to help. ). He had an adverse reaction to minocycline in the past with a bad rash all over his upper extremities. He works the night shift.    The following portions of the chart were reviewed this encounter and updated as appropriate:  Tobacco  Allergies  Meds  Problems  Med Hx  Surg Hx  Fam Hx      Objective  Well appearing patient in no apparent distress; mood and affect are within normal limits.  A focused examination was performed including face. Relevant physical exam findings are noted in the Assessment and Plan.  face Severe: erythema with inflamed papules and scarring.       Assessment & Plan  Rosacea face  doxycycline (VIBRAMYCIN) 100 MG capsule - face Take 1 capsule (100 mg total) by mouth daily.  metroNIDAZOLE (METROCREAM) 0.75 % cream - face Apply topically 2 (two) times daily.     I, Alassane Kalafut, PA-C, have reviewed all documentation's for this visit.  The documentation on 10/09/21 for the exam, diagnosis, procedures and orders are all accurate and complete.

## 2021-12-09 ENCOUNTER — Ambulatory Visit: Payer: 59 | Admitting: Physician Assistant

## 2022-04-30 ENCOUNTER — Ambulatory Visit (INDEPENDENT_AMBULATORY_CARE_PROVIDER_SITE_OTHER): Payer: Self-pay | Admitting: Nurse Practitioner

## 2022-04-30 ENCOUNTER — Encounter: Payer: Self-pay | Admitting: Nurse Practitioner

## 2022-04-30 VITALS — BP 120/83 | HR 87 | Temp 98.3°F | Resp 20 | Ht 75.0 in | Wt 246.0 lb

## 2022-04-30 DIAGNOSIS — L719 Rosacea, unspecified: Secondary | ICD-10-CM

## 2022-04-30 MED ORDER — DOXYCYCLINE HYCLATE 100 MG PO CAPS: 100.0000 mg | ORAL_CAPSULE | Freq: Every day | ORAL | 4 refills | Status: DC

## 2022-04-30 NOTE — Patient Instructions (Signed)
Rosacea Rosacea is a long-term (chronic) condition that affects the skin of the face, including the cheeks, nose, forehead, and chin. This condition can also affect the eyes. Rosacea causes blood vessels near the surface of the skin to get bigger (be enlarged), and that makes the skin red. What are the causes? The cause of this condition is not known. Certain things can make rosacea worse, including: Exercise. Sunlight. Very hot or cold temperatures. Hot or spicy foods and drinks. Drinking alcohol. Stress. Taking blood pressure medicine. Long-term use of topical steroids on the face. What increases the risk? You are more likely to get this condition if you: Are older than 47 years of age. Are a woman. Have light-colored skin (light complexion). Have a family history of the condition. What are the signs or symptoms?  Redness of the face. Red bumps or pimples on the face. A red, enlarged nose. Blushing easily. Red lines on the skin. Eye problems such as: Irritated, burning, or itchy feeling in the eyes. Swollen eyelids. Drainage from the eyes. Feeling like there is something in your eye. How is this treated? There is no cure for this condition, but treatment can help to control your symptoms. Your doctor may suggest that you see a skin specialist (dermatologist). Treatment may include: Medicines that are put on the skin or taken by mouth (orally). Laser treatment to improve how the skin looks. Surgery. This is rare. Your doctor will also suggest the best way to take care of your skin. Even after your skin gets better, you will likely need to continue treatment to keep your rosacea from coming back. Follow these instructions at home: Skin care Take care of your skin as told by your doctor. Your doctor may tell you to do these things: Wash your skin gently two or more times each day. Use mild soap. Use a sunscreen or sunblock with SPF 30 or greater. Use gentle cosmetics that are  meant for sensitive skin. Shave with an electric shaver instead of a blade. Lifestyle Try to keep track of what foods make this condition worse. Avoid those foods. These may include: Spicy foods. Seafood. Cheese. Hot liquids. Nuts. Chocolate. Iodized salt. Do not drink alcohol. Avoid very cold or hot temperatures. Try to reduce your stress. If you need help to do this, talk with your doctor. When you exercise, do these things to stay cool: Limit sun exposure to your face. Use a fan. Exercise for a shorter time, and exercise more often. General instructions Take and apply over-the-counter and prescription medicines only as told by your doctor. If you were prescribed antibiotics, apply it or take them as told by your doctor. Do not stop using them even if your condition improves. If your eyelids are affected, hold warm compresses on them. Do this as told by your doctor. Keep all follow-up visits. Contact a doctor if: Your symptoms get worse. Your symptoms do not improve after 2 months of treatment. You have new symptoms. You have any changes in how you see (vision) or you have problems with your eyes, such as redness or itching. You feel very sad (depressed). You do not want to eat as much as normal (lose your appetite). You have trouble focusing your mind (concentrating). Summary Rosacea is a long-term condition that affects the skin of the face, including the cheeks, nose, forehead, and chin. Take care of your skin as told by your doctor. Take and apply medicines only as told by your doctor. Contact a doctor if   your symptoms get worse or if you have problems with your eyes. Keep all follow-up visits. This information is not intended to replace advice given to you by your health care provider. Make sure you discuss any questions you have with your health care provider. Document Revised: 06/18/2021 Document Reviewed: 06/18/2021 Elsevier Patient Education  2023 Elsevier Inc.  

## 2022-04-30 NOTE — Progress Notes (Signed)
   Subjective:    Patient ID: Norman Hardy, male    DOB: 1975/10/08, 46 y.o.   MRN: [id number]   Chief Complaint: Areas to skin   HPI Patient had bad rash on his face and was sent to dermatology. He was dx with roasecea. They treated him with doxycycline and metrocream. Improved greatly. Derm office closed before he could have follow up. He is now out of meds and would like refill.    Review of Systems  Constitutional:  Negative for diaphoresis.  Eyes:  Negative for pain.  Respiratory:  Negative for shortness of breath.   Cardiovascular:  Negative for chest pain, palpitations and leg swelling.  Gastrointestinal:  Negative for abdominal pain.  Endocrine: Negative for polydipsia.  Skin:  Negative for rash.  Neurological:  Negative for dizziness, weakness and headaches.  Hematological:  Does not bruise/bleed easily.  All other systems reviewed and are negative.      Objective:   Physical Exam Vitals and nursing note reviewed.  Constitutional:      Appearance: Normal appearance. He is well-developed.  Neck:     Thyroid: No thyroid mass or thyromegaly.     Vascular: No carotid bruit or JVD.     Trachea: Phonation normal.  Cardiovascular:     Rate and Rhythm: Normal rate and regular rhythm.  Pulmonary:     Effort: Pulmonary effort is normal. No respiratory distress.     Breath sounds: Normal breath sounds.  Abdominal:     General: Bowel sounds are normal.     Palpations: Abdomen is soft.     Tenderness: There is no abdominal tenderness.  Musculoskeletal:        General: Normal range of motion.     Cervical back: Normal range of motion and neck supple.  Lymphadenopathy:     Cervical: No cervical adenopathy.  Skin:    General: Skin is warm and dry.     Comments: Bil checks erythematous with closed conedomes  Neurological:     Mental Status: He is alert and oriented to person, place, and time.  Psychiatric:        Behavior: Behavior normal.        Thought Content:  Thought content normal.        Judgment: Judgment normal.     BP 120/83   Pulse 87   Temp 98.3 F (36.8 C) (Temporal)   Resp 20   Ht 6\' 3"  (1.905 m)   Wt 246 lb (111.6 kg)   SpO2 97%   BMI 30.75 kg/m        Assessment & Plan:   in today with chief complaint of Areas to skin   1. Rosacea Continue cream as needed Wash face 2x a day with mild soap Do not pick or scratch at areas. - doxycycline (VIBRAMYCIN) 100 MG capsule; Take 1 capsule (100 mg total) by mouth daily.  Dispense: 30 capsule; Refill: 4    The above assessment and management plan was discussed with the patient. The patient verbalized understanding of and has agreed to the management plan. Patient is aware to call the clinic if symptoms persist or worsen. Patient is aware when to return to the clinic for a follow-up visit. Patient educated on when it is appropriate to go to the emergency department.   Mary-Margaret Park Breed, FNP

## 2022-06-19 ENCOUNTER — Telehealth: Payer: Self-pay

## 2022-06-19 NOTE — Telephone Encounter (Signed)
Transition Care Management Unsuccessful Follow-up Telephone Call  Date of discharge and from where:  [facility] 06/18/2022  Attempts:  1st Attempt  Reason for unsuccessful TCM follow-up call:  Left voice message Juanda Crumble, [place] Direct Dial [phone]

## 2022-06-22 NOTE — Telephone Encounter (Signed)
Transition Care Management Unsuccessful Follow-up Telephone Call  Date of discharge and from where:  [facility] 06/18/2022  Attempts:  2nd Attempt  Reason for unsuccessful TCM follow-up call:  Left voice message Juanda Crumble, [place] Direct Dial [phone]

## 2022-06-23 NOTE — Transitions of Care (Post Inpatient/ED Visit) (Signed)
   06/23/2022  Name: DELVIS KAU MRN: [id number] DOB: 1976-01-18  Today's TOC FU Call Status: Today's TOC FU Call Status:: Unsuccessful Call (3rd Attempt) Unsuccessful Call (3rd Attempt) Date: 06/23/22  Attempted to reach the patient regarding the most recent Inpatient/ED visit.  Follow Up Plan: No further outreach attempts will be made at this time. We have been unable to contact the patient.  Signature Juanda Crumble, [place] Direct Dial [phone]

## 2022-07-08 ENCOUNTER — Ambulatory Visit: Payer: Self-pay | Admitting: Family Medicine

## 2022-07-08 ENCOUNTER — Encounter: Payer: Self-pay | Admitting: Family Medicine

## 2022-07-08 VITALS — BP 117/82 | HR 91 | Temp 97.8°F | Ht 75.0 in | Wt 255.2 lb

## 2022-07-08 DIAGNOSIS — L732 Hidradenitis suppurativa: Secondary | ICD-10-CM

## 2022-07-08 MED ORDER — LINEZOLID 600 MG PO TABS: 600.0000 mg | ORAL_TABLET | Freq: Two times a day (BID) | ORAL | 0 refills | Status: DC

## 2022-07-08 NOTE — Progress Notes (Signed)
Subjective:  Patient ID: Norman Hardy, male    DOB: [birth year]  Age: 47 y.o. MRN: [id number]  CC: ER FOLLOW UP   HPI Norman Hardy presents for multiple episodes of hydradenitis of the groin. Thirteen times it has been lanced. Onset was 2018. Causes pain, nausea, and dizziness due to the pain. 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 Has appt. For evaluation at [facility] in 2 weeks. He has an appt. With a hydradenitis specialist in [place] in May.     07/08/2022    2:39 PM 04/30/2022   10:48 AM 03/28/2021   10:07 AM  Depression screen PHQ 2/9  Decreased Interest '2 2 1  '$ Down, Depressed, Hopeless '2 1 1  '$ PHQ - 2 Score '4 3 2  '$ Altered sleeping 2 1 0  Tired, decreased energy '2 1 1  '$ Change in appetite 2 0 0  Feeling bad or failure about yourself  '2 2 1  '$ Trouble concentrating 1 1 0  Moving slowly or fidgety/restless 0 0 0  Suicidal thoughts 1 0 0  PHQ-9 Score '14 8 4  '$ Difficult doing work/chores Very difficult Not difficult at all Not difficult at all    History [name] has a past medical history of Anxiety.   He has no past surgical history on file.   His family history is not on file.He reports that he has never smoked. He has never used smokeless tobacco. No history on file for alcohol use and drug use.    ROS Review of Systems  Constitutional:  Negative for fever.  Respiratory:  Negative for shortness of breath.   Cardiovascular:  Negative for chest pain.  Musculoskeletal:  Negative for arthralgias.  Skin:  Negative for rash.    Objective:  BP 117/82   Pulse 91   Temp 97.8 F (36.6 C)   Ht '6\' 3"'$  (1.905 m)   Wt 255 lb 3.2 oz (115.8 kg)   SpO2 98%   BMI 31.90 kg/m   BP Readings from Last 3 Encounters:  07/08/22 117/82  04/30/22 120/83  03/28/21 116/89    Wt Readings from Last 3 Encounters:  07/08/22 255 lb 3.2 oz (115.8 kg)  04/30/22 246 lb (111.6 kg)  03/28/21 242 lb (109.8 kg)     Physical Exam  Groin and scrotum have multiple areas of edema and  mucopurlent drainage12 areas with mucopurulent DC from skin noted.  Assessment & Plan:   Draxler was seen today for er follow up.  Diagnoses and all orders for this visit:  Hydradenitis  Other orders -     linezolid (ZYVOX) 600 MG tablet; Take 1 tablet (600 mg total) by mouth 2 (two) times daily.       I have discontinued Marland Kitchen L. Conaty's doxycycline. I am also having him start on linezolid. Additionally, I am having him maintain his metroNIDAZOLE.  Allergies as of 07/08/2022       Reactions   Minocycline Hcl Anaphylaxis        Medication List        Accurate as of July 08, 2022  3:00 PM. If you have any questions, ask your nurse or doctor.          STOP taking these medications    doxycycline 100 MG capsule Commonly known as: VIBRAMYCIN Stopped by: Claretta Fraise, MD       TAKE these medications    linezolid 600 MG tablet Commonly known as: Zyvox Take 1 tablet (600 mg total) by mouth 2 (  two) times daily. Started by: Claretta Fraise, MD   metroNIDAZOLE 0.75 % cream Commonly known as: METROCREAM Apply topically 2 (two) times daily.       Consider BoTox as an option.  Follow-up: Return if symptoms worsen or fail to improve.  Claretta Fraise, M.D.

## 2022-07-09 ENCOUNTER — Other Ambulatory Visit: Payer: Self-pay | Admitting: Family Medicine

## 2022-07-09 ENCOUNTER — Telehealth: Payer: Self-pay | Admitting: Nurse Practitioner

## 2022-07-09 MED ORDER — SULFAMETHOXAZOLE-TRIMETHOPRIM 800-160 MG PO TABS: 1.0000 | ORAL_TABLET | Freq: Two times a day (BID) | ORAL | 0 refills | Status: DC

## 2022-07-09 NOTE — Telephone Encounter (Signed)
Please let the patient know that I sent their prescription to their pharmacy. Thanks, WS 

## 2022-07-10 NOTE — Telephone Encounter (Signed)
Left detailed message - rx sent to pharmacy

## 2022-07-28 ENCOUNTER — Telehealth: Payer: Self-pay | Admitting: Nurse Practitioner

## 2022-07-28 NOTE — Telephone Encounter (Signed)
Actually followed by you. I just switched the antibiotic because of cost.

## 2022-07-28 NOTE — Telephone Encounter (Signed)
Why does he need another antibiotic

## 2022-07-29 NOTE — Telephone Encounter (Signed)
Lmtcb.

## 2022-07-29 NOTE — Telephone Encounter (Signed)
Patient was seen in ER on 2/8 at [facility] and transferred to [facility] on 2/9 and admitted for Fourniers Gangrene. After discharge he did a TOC with [facility] on 2/28 and was given antibiotics until his follow up at [facility] in 2 weeks. They changed his appointment out there until April 11 and he wants to know if he can have some antibiotics to continue to hold things off until that appt?

## 2022-07-29 NOTE — Telephone Encounter (Signed)
If has healed he should not need any more antibiotics

## 2022-07-29 NOTE — Telephone Encounter (Signed)
Patient states that when he was on his abx for hydradenitis the pain went away.  Now that he is off abx the pain started to come back.  Thinks he needs another abx.  Aware you are out of the office today.

## 2022-07-31 ENCOUNTER — Ambulatory Visit: Payer: BC Managed Care – PPO | Admitting: Nurse Practitioner

## 2022-07-31 ENCOUNTER — Encounter: Payer: Self-pay | Admitting: Nurse Practitioner

## 2022-07-31 VITALS — BP 124/85 | HR 74 | Temp 98.1°F | Resp 20 | Ht 75.0 in | Wt 256.0 lb

## 2022-07-31 DIAGNOSIS — L732 Hidradenitis suppurativa: Secondary | ICD-10-CM | POA: Diagnosis not present

## 2022-07-31 MED ORDER — SULFAMETHOXAZOLE-TRIMETHOPRIM 800-160 MG PO TABS: 1.0000 | ORAL_TABLET | Freq: Two times a day (BID) | ORAL | 0 refills | Status: AC

## 2022-07-31 NOTE — Progress Notes (Signed)
   Subjective:    Patient ID: Norman Hardy, male    DOB: October 12, 1975, 47 y.o.   MRN: [id number]   Chief Complaint: hidradenitis  HPI Patient has had hidradenitis for several years. Has been bad lately and has been on antibiotics. He had appointment with derm to discuss options nut the derm office pushed hi appointment back a month. He says the antibiotics help and he would like to stay on them until he has derm appointment.   He has had to have surgery a couple of times in groin area for his hidradenitis. Patient Active Problem List   Diagnosis Date Noted   Allergic reaction 04/15/2017   Abscess of right buttock 01/20/2017       Review of Systems  Constitutional:  Negative for diaphoresis.  Eyes:  Negative for pain.  Respiratory:  Negative for shortness of breath.   Cardiovascular:  Negative for chest pain, palpitations and leg swelling.  Gastrointestinal:  Negative for abdominal pain.  Endocrine: Negative for polydipsia.  Skin:  Negative for rash.  Neurological:  Negative for dizziness, weakness and headaches.  Hematological:  Does not bruise/bleed easily.  All other systems reviewed and are negative.      Objective:   Physical Exam Vitals reviewed.  Constitutional:      Appearance: Normal appearance.  Cardiovascular:     Rate and Rhythm: Normal rate and regular rhythm.     Heart sounds: Normal heart sounds.  Pulmonary:     Breath sounds: Normal breath sounds.  Skin:    General: Skin is warm.     Comments: Right side of scrotom edematous and indurated with fissures  Neurological:     General: No focal deficit present.     Mental Status: He is alert and oriented to person, place, and time.     Motor: Weakness present.  Psychiatric:        Mood and Affect: Mood normal.        Behavior: Behavior normal.    BP 124/85   Pulse 74   Temp 98.1 F (36.7 C) (Temporal)   Resp 20   Ht 6\' 3"  (1.905 m)   Wt 256 lb (116.1 kg)   SpO2 100%   BMI 32.00 kg/m          Assessment & Plan:  Norman Hardy in today with chief complaint of No chief complaint on file.   1. Hydradenitis Avoid rubbing area Keep appointment with specialist  Meds ordered this encounter  Medications   sulfamethoxazole-trimethoprim (BACTRIM DS) 800-160 MG tablet    Sig: Take 1 tablet by mouth 2 (two) times daily.    Dispense:  30 tablet    Refill:  0    Order Specific Question:   Supervising Provider    Answer:   Caryl Pina A [id number]       The above assessment and management plan was discussed with the patient. The patient verbalized understanding of and has agreed to the management plan. Patient is aware to call the clinic if symptoms persist or worsen. Patient is aware when to return to the clinic for a follow-up visit. Patient educated on when it is appropriate to go to the emergency department.   Mary-Margaret Hassell Done, FNP

## 2022-07-31 NOTE — Patient Instructions (Signed)
Hidradenitis Suppurativa Hidradenitis suppurativa is a long-term (chronic) skin disease. It is similar to a severe form of acne, but it affects areas of the body where acne would be unusual, especially areas of the body where skin rubs against skin and becomes moist. These include: Underarms. Groin. Genital area. Buttocks. Upper thighs. Breasts. Hidradenitis suppurativa may start out as small lumps or pimples caused by blocked skin pores, sweat glands, or hair follicles. Pimples may develop into deep sores that break open (rupture) and drain pus. Over time, affected areas of skin may thicken and become scarred. This condition is rare and does not spread from person to person (non-contagious). What are the causes? The exact cause of this condition is not known. It may be related to: Male and male hormones. An overactive disease-fighting system (immune system). The immune system may over-react to blocked hair follicles or sweat glands and cause swelling and pus-filled sores. What increases the risk? You are more likely to develop this condition if you: Are male. Are 11-55 years old. Have a family history of hidradenitis suppurativa. Have a personal history of acne. Are overweight. Smoke. Take the medicine lithium. What are the signs or symptoms? The first symptoms are usually painful bumps in the skin, similar to pimples. The condition may get worse over time (progress), or it may only cause mild symptoms. If the disease progresses, symptoms may include: Skin bumps getting bigger and growing deeper into the skin. Bumps rupturing and draining pus. Itchy, infected skin. Skin getting thicker and scarred. Tunnels under the skin (fistulas) where pus drains from a bump. Pain during daily activities, such as pain during walking if your groin area is affected. Emotional problems, such as stress or depression. This condition may affect your appearance and your ability or willingness to wear  certain clothes or do certain activities. How is this diagnosed? This condition is diagnosed by a health care provider who specializes in skin conditions (dermatologist). You may be diagnosed based on: Your symptoms and medical history. A physical exam. Testing a pus sample for infection. Blood tests. How is this treated? Your treatment will depend on how severe your symptoms are. The same treatment will not work for everybody with this condition. You may need to try several treatments to find what works best for you. Treatment may include: Cleaning and bandaging (dressing) your wounds as needed. Lifestyle changes, such as new skin care routines. Taking medicines, such as: Antibiotics. Acne medicines. Medicines to reduce the activity of the immune system. A diabetes medicine (metformin). Birth control pills, for women. Steroids to reduce swelling and pain. Working with a mental health care provider, if you experience emotional distress due to this condition. If you have severe symptoms that do not get better with medicine, you may need surgery. Surgery may involve: Using a laser to clear the skin and remove hair follicles. Opening and draining deep sores. Removing the areas of skin that are diseased and scarred. Follow these instructions at home: Medicines  Take over-the-counter and prescription medicines only as told by your health care provider. If you were prescribed antibiotics, take them as told by your health care provider. Do not stop using the antibiotic even if your condition improves. Skin care If you have open wounds, cover them with a clean dressing as told by your health care provider. Keep wounds clean by washing them gently with soap and water when you bathe. Do not shave the areas where you get hidradenitis suppurativa. Wear loose-fitting clothes. Try to avoid   getting overheated or sweaty. If you get sweaty or wet, change into clean, dry clothes as soon as you can. To  help relieve pain and itchiness, cover sore areas with a warm, clean washcloth (warm compress) for 5-10 minutes as often as needed. Your healthcare provider may recommend an antiperspirant deodorant that may be gentle on your skin. A daily antiseptic wash to cleanse affected areas may be suggested by your healthcare provider. General instructions Learn as much as you can about your disease so that you have an active role in your treatment. Work closely with your health care provider to find treatments that work for you. If you are overweight, work with your health care provider to lose weight as recommended. Do not use any products that contain nicotine or tobacco. These products include cigarettes, chewing tobacco, and vaping devices, such as e-cigarettes. If you need help quitting, ask your health care provider. If you struggle with living with this condition, talk with your health care provider or work with a mental health care provider as recommended. Keep all follow-up visits. Where to find more information Hidradenitis Suppurativa Foundation, Inc.: www.hs-foundation.org American Academy [employer] Dermatology: www.aad.org Contact a health care provider if: You have a flare-up of hidradenitis suppurativa. You have a fever or chills. You have trouble controlling your symptoms at home. You have trouble doing your daily activities because of your symptoms. You have trouble dealing with emotional problems related to your condition. Summary Hidradenitis suppurativa is a long-term (chronic) skin disease. It is similar to a severe form of acne, but it affects areas of the body where acne would be unusual. The first symptoms are usually painful bumps in the skin, similar to pimples. The condition may only cause mild symptoms, or it may get worse over time (progress). If you have open wounds, cover them with a clean dressing as told by your health care provider. Keep wounds clean by washing them gently with  soap and water when you bathe. Besides skin care, treatment may include medicines, laser treatment, and surgery. This information is not intended to replace advice given to you by your health care provider. Make sure you discuss any questions you have with your health care provider. Document Revised: 06/18/2021 Document Reviewed: 06/18/2021 Elsevier Patient Education  2023 Elsevier Inc.  

## 2022-08-12 DIAGNOSIS — L732 Hidradenitis suppurativa: Secondary | ICD-10-CM | POA: Diagnosis not present

## 2022-09-14 DIAGNOSIS — L732 Hidradenitis suppurativa: Secondary | ICD-10-CM | POA: Diagnosis not present

## 2022-09-24 DIAGNOSIS — L089 Local infection of the skin and subcutaneous tissue, unspecified: Secondary | ICD-10-CM | POA: Diagnosis not present

## 2022-09-24 DIAGNOSIS — L732 Hidradenitis suppurativa: Secondary | ICD-10-CM | POA: Diagnosis not present

## 2022-09-24 DIAGNOSIS — Z5181 Encounter for therapeutic drug level monitoring: Secondary | ICD-10-CM | POA: Diagnosis not present
